# Patient Record
Sex: Female | Born: 1955
Health system: Southern US, Community
[De-identification: ages and names within clinical notes are randomized; demographics above are authoritative.]

## PROBLEM LIST (undated history)

## (undated) DIAGNOSIS — K219 Gastro-esophageal reflux disease without esophagitis: Secondary | ICD-10-CM

## (undated) DIAGNOSIS — E119 Type 2 diabetes mellitus without complications: Secondary | ICD-10-CM

## (undated) DIAGNOSIS — E039 Hypothyroidism, unspecified: Secondary | ICD-10-CM

## (undated) DIAGNOSIS — R911 Solitary pulmonary nodule: Secondary | ICD-10-CM

## (undated) DIAGNOSIS — E785 Hyperlipidemia, unspecified: Secondary | ICD-10-CM

## (undated) DIAGNOSIS — I10 Essential (primary) hypertension: Secondary | ICD-10-CM

## (undated) HISTORY — PX: BREAST CYST ASPIRATION: SHX578

## (undated) HISTORY — PX: ABDOMINAL HYSTERECTOMY: SHX81

## (undated) HISTORY — PX: BACK SURGERY: SHX140

## (undated) HISTORY — DX: Hyperlipidemia, unspecified: E78.5

## (undated) HISTORY — PX: THYROID SURGERY: SHX805

## (undated) HISTORY — PX: COLONOSCOPY: SHX174

## (undated) HISTORY — DX: Solitary pulmonary nodule: R91.1

## (undated) HISTORY — DX: Hypothyroidism, unspecified: E03.9

## (undated) HISTORY — DX: Essential (primary) hypertension: I10

## (undated) HISTORY — PX: LUNG BIOPSY: SHX232

---

## 1997-03-22 ENCOUNTER — Encounter: Payer: Self-pay | Admitting: Internal Medicine

## 1998-03-17 ENCOUNTER — Ambulatory Visit (HOSPITAL_COMMUNITY): Admission: RE | Admit: 1998-03-17 | Discharge: 1998-03-17 | Payer: Self-pay | Admitting: Surgery

## 1998-08-06 ENCOUNTER — Ambulatory Visit (HOSPITAL_COMMUNITY): Admission: RE | Admit: 1998-08-06 | Discharge: 1998-08-06 | Payer: Self-pay | Admitting: Pulmonary Disease

## 1999-10-16 ENCOUNTER — Encounter: Payer: Self-pay | Admitting: Internal Medicine

## 1999-10-16 ENCOUNTER — Emergency Department (HOSPITAL_COMMUNITY): Admission: EM | Admit: 1999-10-16 | Discharge: 1999-10-16 | Payer: Self-pay | Admitting: Emergency Medicine

## 2001-06-02 ENCOUNTER — Ambulatory Visit (HOSPITAL_COMMUNITY): Admission: RE | Admit: 2001-06-02 | Discharge: 2001-06-02 | Payer: Self-pay | Admitting: Family Medicine

## 2001-06-22 ENCOUNTER — Encounter: Admission: RE | Admit: 2001-06-22 | Discharge: 2001-06-22 | Payer: Self-pay | Admitting: Family Medicine

## 2001-06-22 ENCOUNTER — Encounter: Payer: Self-pay | Admitting: Family Medicine

## 2006-04-01 ENCOUNTER — Encounter: Admission: RE | Admit: 2006-04-01 | Discharge: 2006-04-01 | Payer: Self-pay | Admitting: Family Medicine

## 2006-04-08 ENCOUNTER — Encounter: Admission: RE | Admit: 2006-04-08 | Discharge: 2006-04-08 | Payer: Self-pay | Admitting: Family Medicine

## 2006-04-08 ENCOUNTER — Encounter (INDEPENDENT_AMBULATORY_CARE_PROVIDER_SITE_OTHER): Payer: Self-pay | Admitting: Specialist

## 2006-07-06 ENCOUNTER — Other Ambulatory Visit: Admission: RE | Admit: 2006-07-06 | Discharge: 2006-07-06 | Payer: Self-pay | Admitting: Nurse Practitioner

## 2007-05-17 ENCOUNTER — Encounter: Admission: RE | Admit: 2007-05-17 | Discharge: 2007-05-17 | Payer: Self-pay | Admitting: Physician Assistant

## 2007-12-12 ENCOUNTER — Inpatient Hospital Stay (HOSPITAL_COMMUNITY): Admission: RE | Admit: 2007-12-12 | Discharge: 2007-12-15 | Payer: Self-pay | Admitting: Neurosurgery

## 2008-05-13 ENCOUNTER — Ambulatory Visit: Payer: Self-pay | Admitting: *Deleted

## 2008-07-01 ENCOUNTER — Ambulatory Visit: Payer: Self-pay | Admitting: Internal Medicine

## 2008-07-01 ENCOUNTER — Encounter (INDEPENDENT_AMBULATORY_CARE_PROVIDER_SITE_OTHER): Payer: Self-pay | Admitting: Family Medicine

## 2008-07-01 LAB — CONVERTED CEMR LAB
ALT: 22 units/L (ref 0–35)
AST: 17 units/L (ref 0–37)
Albumin: 4 g/dL (ref 3.5–5.2)
Alkaline Phosphatase: 94 units/L (ref 39–117)
BUN: 8 mg/dL (ref 6–23)
Basophils Absolute: 0 10*3/uL (ref 0.0–0.1)
Basophils Relative: 0 % (ref 0–1)
CO2: 22 meq/L (ref 19–32)
Calcium: 9.5 mg/dL (ref 8.4–10.5)
Chloride: 104 meq/L (ref 96–112)
Cholesterol: 177 mg/dL (ref 0–200)
Creatinine, Ser: 0.85 mg/dL (ref 0.40–1.20)
Eosinophils Absolute: 0.2 10*3/uL (ref 0.0–0.7)
Eosinophils Relative: 3 % (ref 0–5)
Free T4: 1.34 ng/dL (ref 0.89–1.80)
Glucose, Bld: 95 mg/dL (ref 70–99)
HCT: 39.3 % (ref 36.0–46.0)
Hemoglobin: 13.3 g/dL (ref 12.0–15.0)
Lymphocytes Relative: 27 % (ref 12–46)
Lymphs Abs: 1.5 10*3/uL (ref 0.7–4.0)
MCHC: 33.8 g/dL (ref 30.0–36.0)
MCV: 89.9 fL (ref 78.0–100.0)
Monocytes Absolute: 0.4 10*3/uL (ref 0.1–1.0)
Monocytes Relative: 7 % (ref 3–12)
Neutro Abs: 3.5 10*3/uL (ref 1.7–7.7)
Neutrophils Relative %: 62 % (ref 43–77)
Platelets: 329 10*3/uL (ref 150–400)
Potassium: 3.8 meq/L (ref 3.5–5.3)
RBC: 4.37 M/uL (ref 3.87–5.11)
RDW: 13.6 % (ref 11.5–15.5)
Rhuematoid fact SerPl-aCnc: <20 intl units/mL (ref 0–20)
Sodium: 135 meq/L (ref 135–145)
TSH: 0.61 microintl units/mL (ref 0.350–4.50)
Total Bilirubin: 0.3 mg/dL (ref 0.3–1.2)
Total Protein: 9.6 g/dL — ABNORMAL HIGH (ref 6.0–8.3)
WBC: 5.6 10*3/uL (ref 4.0–10.5)

## 2008-09-30 ENCOUNTER — Ambulatory Visit: Payer: Self-pay | Admitting: Family Medicine

## 2008-09-30 LAB — CONVERTED CEMR LAB
ALT: 29 units/L (ref 0–35)
AST: 22 units/L (ref 0–37)
Albumin: 4.1 g/dL (ref 3.5–5.2)
Alkaline Phosphatase: 87 units/L (ref 39–117)
BUN: 11 mg/dL (ref 6–23)
CO2: 23 meq/L (ref 19–32)
Calcium: 9 mg/dL (ref 8.4–10.5)
Chloride: 104 meq/L (ref 96–112)
Creatinine, Ser: 0.93 mg/dL (ref 0.40–1.20)
Glucose, Bld: 113 mg/dL — ABNORMAL HIGH (ref 70–99)
Potassium: 4.1 meq/L (ref 3.5–5.3)
Sodium: 138 meq/L (ref 135–145)
Total Bilirubin: 0.3 mg/dL (ref 0.3–1.2)
Total Protein: 9.4 g/dL — ABNORMAL HIGH (ref 6.0–8.3)

## 2008-11-13 ENCOUNTER — Encounter: Payer: Self-pay | Admitting: Internal Medicine

## 2008-12-09 DIAGNOSIS — M359 Systemic involvement of connective tissue, unspecified: Secondary | ICD-10-CM | POA: Insufficient documentation

## 2008-12-09 DIAGNOSIS — J309 Allergic rhinitis, unspecified: Secondary | ICD-10-CM | POA: Insufficient documentation

## 2008-12-09 DIAGNOSIS — I10 Essential (primary) hypertension: Secondary | ICD-10-CM | POA: Insufficient documentation

## 2008-12-09 DIAGNOSIS — E039 Hypothyroidism, unspecified: Secondary | ICD-10-CM | POA: Insufficient documentation

## 2008-12-10 ENCOUNTER — Ambulatory Visit: Payer: Self-pay | Admitting: Internal Medicine

## 2008-12-10 DIAGNOSIS — J984 Other disorders of lung: Secondary | ICD-10-CM | POA: Insufficient documentation

## 2008-12-10 DIAGNOSIS — R0609 Other forms of dyspnea: Secondary | ICD-10-CM | POA: Insufficient documentation

## 2008-12-10 DIAGNOSIS — R0989 Other specified symptoms and signs involving the circulatory and respiratory systems: Secondary | ICD-10-CM | POA: Insufficient documentation

## 2008-12-10 DIAGNOSIS — R918 Other nonspecific abnormal finding of lung field: Secondary | ICD-10-CM | POA: Insufficient documentation

## 2008-12-19 ENCOUNTER — Telehealth (INDEPENDENT_AMBULATORY_CARE_PROVIDER_SITE_OTHER): Payer: Self-pay | Admitting: *Deleted

## 2008-12-19 ENCOUNTER — Encounter: Payer: Self-pay | Admitting: Internal Medicine

## 2009-01-07 ENCOUNTER — Encounter: Payer: Self-pay | Admitting: Internal Medicine

## 2009-01-09 ENCOUNTER — Ambulatory Visit: Payer: Self-pay | Admitting: Internal Medicine

## 2009-01-28 ENCOUNTER — Encounter: Admission: RE | Admit: 2009-01-28 | Discharge: 2009-01-28 | Payer: Self-pay | Admitting: Family Medicine

## 2009-04-07 ENCOUNTER — Encounter: Payer: Self-pay | Admitting: Internal Medicine

## 2009-05-19 ENCOUNTER — Telehealth: Payer: Self-pay | Admitting: Internal Medicine

## 2009-05-19 ENCOUNTER — Ambulatory Visit: Payer: Self-pay | Admitting: Internal Medicine

## 2009-07-18 ENCOUNTER — Ambulatory Visit: Payer: Self-pay | Admitting: Internal Medicine

## 2009-07-18 ENCOUNTER — Encounter: Payer: Self-pay | Admitting: Internal Medicine

## 2009-07-21 ENCOUNTER — Encounter: Payer: Self-pay | Admitting: Internal Medicine

## 2009-07-21 LAB — CONVERTED CEMR LAB
BUN: 8 mg/dL (ref 6–23)
Basophils Absolute: 0 10*3/uL (ref 0.0–0.1)
Basophils Relative: 0.4 % (ref 0.0–3.0)
CO2: 29 meq/L (ref 19–32)
Calcium: 9.9 mg/dL (ref 8.4–10.5)
Chloride: 102 meq/L (ref 96–112)
Creatinine, Ser: 1 mg/dL (ref 0.4–1.2)
Eosinophils Absolute: 0.2 10*3/uL (ref 0.0–0.7)
Eosinophils Relative: 3.1 % (ref 0.0–5.0)
GFR calc non Af Amer: 74.28 mL/min (ref >60)
Glucose, Bld: 93 mg/dL (ref 70–99)
HCT: 42.2 % (ref 36.0–46.0)
Hemoglobin: 14.1 g/dL (ref 12.0–15.0)
Lymphocytes Relative: 26.4 % (ref 12.0–46.0)
Lymphs Abs: 1.6 10*3/uL (ref 0.7–4.0)
MCHC: 33.3 g/dL (ref 30.0–36.0)
MCV: 93.2 fL (ref 78.0–100.0)
Monocytes Absolute: 0.3 10*3/uL (ref 0.1–1.0)
Monocytes Relative: 4.3 % (ref 3.0–12.0)
Neutro Abs: 4 10*3/uL (ref 1.4–7.7)
Neutrophils Relative %: 65.8 % (ref 43.0–77.0)
Platelets: 290 10*3/uL (ref 150.0–400.0)
Potassium: 3.9 meq/L (ref 3.5–5.1)
Pro B Natriuretic peptide (BNP): 11 pg/mL (ref 0.0–100.0)
RBC: 4.53 M/uL (ref 3.87–5.11)
RDW: 12.8 % (ref 11.5–14.6)
Sodium: 139 meq/L (ref 135–145)
TSH: 1.37 microintl units/mL (ref 0.35–5.50)
WBC: 6.1 10*3/uL (ref 4.5–10.5)

## 2009-12-31 ENCOUNTER — Ambulatory Visit: Payer: Self-pay | Admitting: Internal Medicine

## 2010-01-13 ENCOUNTER — Telehealth (INDEPENDENT_AMBULATORY_CARE_PROVIDER_SITE_OTHER): Payer: Self-pay | Admitting: *Deleted

## 2010-02-18 ENCOUNTER — Encounter: Admission: RE | Admit: 2010-02-18 | Discharge: 2010-02-18 | Payer: Self-pay | Admitting: Family Medicine

## 2010-08-11 ENCOUNTER — Other Ambulatory Visit: Admission: RE | Admit: 2010-08-11 | Discharge: 2010-08-11 | Payer: Self-pay | Admitting: Obstetrics and Gynecology

## 2010-11-23 ENCOUNTER — Encounter
Admission: RE | Admit: 2010-11-23 | Discharge: 2010-11-23 | Payer: Self-pay | Source: Home / Self Care | Attending: Family Medicine | Admitting: Family Medicine

## 2010-12-25 ENCOUNTER — Ambulatory Visit: Admit: 2010-12-25 | Payer: Self-pay | Admitting: Internal Medicine

## 2011-01-07 NOTE — Progress Notes (Signed)
Summary: notes  Phone Note From Other Clinic Call back at [phone]   Caller: dr. Katina Degree Office - [place] Call For: wert Summary of Call: want last few office notes and labs sent to Dr. Sanjuana Letters [phone] Initial call taken by: Eugene Gavia,  December 19, 2008 9:54 AM  Follow-up for Phone Call        Done. Consultation note from 1/5 along with cxr faxed via [facility] Follow-up by: Vernie Murders,  December 19, 2008 10:02 AM

## 2011-01-07 NOTE — Progress Notes (Signed)
----   Converted from flag ---- ---- 07/19/2009 6:16 AM, Nyoka Cowden MD wrote: needs cxr by now to f/u lung nodules ( q 6 mon needed and last done 07/18/09) ------------------------------  Pt was seen 12/31/09 with cxr. Karla Erickson  January 13, 2010 3:38 PM

## 2011-01-07 NOTE — Assessment & Plan Note (Signed)
Summary: Pulmonary/ ext summary fu with pfts   Referred by:  Lavonda Jumbo PA PCP:  Lavonda Jumbo PA  Chief Complaint:  1 month followup with PFT's.  Pt states that breathing is the same no better or worse.  She denies any new complaints today.Karla Erickson  History of Present Illness: 55 yobf quit smoking 1998 due to "strong words from the doctor" after lung bx by Edwyna Shell showing nonspecific inflammation in a left upper lobe nodule.  Subsequently seen by Carolin Sicks, at [facility] and [facility] with dx of connective tissue dx, RA like, with lung involvement.   December 10, 2008 seen as consult for E Griffin for doe x  worse since 1998 assoc with 100 lb of weight gain to point now where can still do grocery store but not the malls, limited also by knees  Also occ wake up and can't breath x sev years.  No itching or sneezing. No hemoptysis or sign cough.  impression was this was pf assoc with connective tissue dz/ obesity with possible GERD rx with diet as a first start  January 09, 2009 ov for PFT's c/o knees > sob with activity, beginning to lose wt, observing GERD diet. No assoc cough/hoarseness or  increase sob over baseline.  Pt denies any significant sore throat, nasal congestion or excess secretions, fever, chills, sweats, unintended wt loss, pleuritic or exertional cp, orthopnea pnd or leg swelling.  Pt also denies any obvious fluctuation in symptoms with weather or environmental change or other alleviating or aggravating factors.             Updated Prior Medication List: ALLEGRA 180 MG TABS (FEXOFENADINE HCL) 1 once daily LEVOTHYROXINE SODIUM 125 MCG TABS (LEVOTHYROXINE SODIUM) 1 once daily AMLODIPINE BESYLATE 5 MG TABS (AMLODIPINE BESYLATE) 1 once daily TYLENOL EXTRA STRENGTH 500 MG TABS (ACETAMINOPHEN) as directed as needed  Current Allergies (reviewed today): No known allergies  Past Medical History:    MORBID OBESITY (ICD-278.01)        - Target wt  =  163 for BMI < 30 , wt =145 when  quit smoking    CONNECTIVE TISSUE DISEASE (ICD-710.9)       - Dx RA by        - PFTs 01/09/09 vital capacity 88% diffusing capacity 66% ERV 41%    HYPOTHYROIDISM (ICD-244.9)    HYPERTENSION (ICD-401.9)    ALLERGIC RHINITIS (ICD-477.9)    Pulmonary nodules        - LUL Bx 03/22/97 Edwyna Shell) = inflammatory with predom lymphocytes and plasma cells, no vasculitis          Family History:    Heart dz- Brother    sister RA    neg lung dz  Social History:    Reviewed history from 12/10/2008 and no changes required:       Former smoker.  Quit in 1998.  Smoked 1/2 ppd x 15 years.       No ETOH       Separated       Children       Unemployed   Review of Systems  The patient denies anorexia, fever, weight loss, weight gain, vision loss, decreased hearing, hoarseness, chest pain, syncope, peripheral edema, prolonged cough, headaches, hemoptysis, abdominal pain, melena, hematochezia, severe indigestion/heartburn, hematuria, incontinence, muscle weakness, suspicious skin lesions, transient blindness, depression, unusual weight change, abnormal bleeding, enlarged lymph nodes, and angioedema.    Vital Signs:  Patient Profile:   55 Years Old Female  Height:     63 inches Weight:      217 pounds O2 Sat:      99 % O2 treatment:    Room Air Temp:     97.8 degrees F oral Pulse rate:   99 / minute BP sitting:   120 / 78  (left arm)  Vitals Entered By: Vernie Murders (January 09, 2009 11:10 AM)                Physical Exam  wt 220 December 10, 2008  > 217 January 09, 2009  Ambulatory obese bf  in no acute distress. Afeb with normal vital signs HEENT: nl dentition, turbinates, and orophanx. Nl external ear canals without cough reflex Neck without JVD/Nodes/TM Lungs clear to A and P bilaterally without cough on insp or exp maneuvers RRR no s3 or murmur or increase in P2 Abd soft and benign with nl excursion in the supine position. No bruits or organomegaly Ext warm without calf  tenderness, cyanosis clubbing or edema Skin warm and dry without lesions     Impression & Recommendations:  Problem # 1:  DYSPNEA (ICD-786.09) I had an extended discussion with the patient today lasting 15 to 20 minutes of a 25 minute visit on the following issues:  I reviewed her PFTs as recorded underPMHx indicating adequate lung vol with disproportionate reduction in expiratory reserve vol which is typical of obesity, not interstitial lung disease.  Note also diffusing capacity corrects to 130%.  regardless of the exact mechanism of her dyspnea, however, at this point I would not expect her to be able to work in a job requiring her to be on her feet or moving around bearing weight, or because of obesity deconditioning and arthritis however than because of limiting lung disease.  she probably does have underlying mild connective tissue disease associated ulnar fibrosis and this does need long-term follow-up on a serial basis for which I recommended she return in 6 months sooner if needed.   Orders: Est. Patient Level IV (29562)   Problem # 2:  PULMONARY NODULE (ICD-518.89) radiographically stable for over a year and previously underwent a biopsy which probably was nothing more than connective tissue disease involvement with the lung and I would not therefore pursue a tissue diagnosis again now. Orders: Est. Patient Level IV (13086)   Medications Added to Medication List This Visit: 1)  Tylenol Extra Strength 500 Mg Tabs (Acetaminophen) .... As directed as needed  Complete Medication List: 1)  Allegra 180 Mg Tabs (Fexofenadine hcl) .Karla Erickson.. 1 once daily 2)  Levothyroxine Sodium 125 Mcg Tabs (Levothyroxine sodium) .Karla Erickson.. 1 once daily 3)  Amlodipine Besylate 5 Mg Tabs (Amlodipine besylate) .Karla Erickson.. 1 once daily 4)  Tylenol Extra Strength 500 Mg Tabs (Acetaminophen) .... As directed as needed  Patient Instructions: 1)  Your lung dz is mild fibrosis/scarring of the lungs most likely the result  of previous pneumonia or rheumatism and is made much worse because of weight gain. 2)  I would not expect you to be able to do physical work for a living but could do desk work full time 3)  Please schedule a follow-up appointment in 6 months sooner if feel breathing is worse - we will do PFT and CXR at that time

## 2011-01-07 NOTE — Letter (Signed)
Summary: [facility]   Imported By: Esmeralda Links D'jimraou 12/27/2008 15:02:31  _____________________________________________________________________  External Attachment:    Type:   Image     Comment:   External Document

## 2011-01-07 NOTE — Letter (Signed)
Summary: Missed appointment/[facility]  Missed appointment/[facility]   Imported By: Esmeralda Links D'jimraou 12/14/2008 10:41:28  _____________________________________________________________________  External Attachment:    Type:   Image     Comment:   External Document

## 2011-01-07 NOTE — Letter (Signed)
Summary: [facility]   Imported By: Esmeralda Links D'jimraou 12/14/2008 10:32:31  _____________________________________________________________________  External Attachment:    Type:   Image     Comment:   External Document

## 2011-01-07 NOTE — Letter (Signed)
Summary: [facility]  [address]   [place], [place] [address]   Phone: [phone]  Fax: [phone]    07/21/2009  Demita Render [address] [place], [place]  [address]  Dear Ms. Albin,    We have been trying to reach you to let you know that your lab results all came back normal and that there are no changes in your therapy needed at this time. Please call our office with a correct phone number that you can be reached at. If you have any questions or concerns, please call our office. Thank you for your time.           Sincerely,   Dr. Sandrea Hughs

## 2011-01-07 NOTE — Letter (Signed)
Summary: [facility]   Imported By: Lester Bright 04/18/2009 11:26:09  _____________________________________________________________________  External Attachment:    Type:   Image     Comment:   External Document

## 2011-01-07 NOTE — Progress Notes (Signed)
Summary: CXR call report  Phone Note From Other Clinic   Caller: Dr. Carmelina Noun radiology Call For: Dr.Wert Summary of Call: Radiology called and stated chest xray shows left lung nodule. Radiologust recs CT of Chest.  Initial call taken by: Carron Curie CMA,  May 19, 2009 11:09 AM  Follow-up for Phone Call        aware, see addendum to cxr and ov same day Follow-up by: Nyoka Cowden MD,  May 19, 2009 7:47 PM

## 2011-01-07 NOTE — Assessment & Plan Note (Signed)
Summary: Pulmonary consultation/ doe plus spn    Visit Type:  Initial Consult Referred by:  Lavonda Jumbo PA PCP:  Lavonda Jumbo PA  Chief Complaint:  SOB.  History of Present Illness: 55 yobf quit smoking 1998 due to "strong words from the doctor" after lung bx by Edwyna Shell showing nonspecific inflammation in a left upper lobe nodule.  Subsequently seen by Carolin Sicks, at [facility] and [facility] with dx of connective tissue dx, RA like, with lung involvement.   December 10, 2008 seen as consult for E Griffin for doe x  worse since 1998 assoc with 100 lb of weight gain to point now where can still do grocery store but not the malls, limited also by knees  Also occ wake up and can't breath x sev years.  No itching or sneezing. No hemoptysis or sign cough.  Pt denies any significant sore throat, nasal congestion or excess secretions, fever, chills, sweats, unintended wt loss, pleuritic or exertional cp, orthopnea pnd or leg swelling.  Pt also denies any obvious fluctuation in symptoms with weather or environmental change or other alleviating or aggravating factors.        Serial Vital Signs/Assessments:  Comments: 9:59 AM Ambulatory Pulse Oximetry  Resting; HR__97___    02 Sat__96%ra___  Lap1 (185 feet)   HR__100___   02 Sat__93%ra___ Lap2 (185 feet)   HR__103___   02 Sat__91%ra___    Lap3 (185 feet)   HR_____   02 Sat_____  ___Test Completed without Difficulty _x__Test Stopped due to: pt c/o sob and pain in knees   By: Vernie Murders      Updated Prior Medication List: ALLEGRA 180 MG TABS (FEXOFENADINE HCL) 1 once daily NABUMETONE 500 MG TABS (NABUMETONE) 1 two times a day FEXMID 7.5 MG TABS (CYCLOBENZAPRINE HCL) 1 three times a day LEVOTHYROXINE SODIUM 125 MCG TABS (LEVOTHYROXINE SODIUM) 1 once daily AMLODIPINE BESYLATE 5 MG TABS (AMLODIPINE BESYLATE) 1 once daily  Current Allergies (reviewed today): No known allergies   Past Medical History:    MORBID OBESITY  (ICD-278.01)        - Target wt  =  163 for BMI < 30 , wt =145 when quit smoking    CONNECTIVE TISSUE DISEASE (ICD-710.9)       - Dx RA by Ashley Akin ?1999    HYPOTHYROIDISM (ICD-244.9)    HYPERTENSION (ICD-401.9)    ALLERGIC RHINITIS (ICD-477.9)    Pulmonary nodules        - LUL Bx 03/22/97 Edwyna Shell) = inflammatory with predom lymphocytes and plasma cells, no vasculitis           Family History:    Heart dz- Brother    sister RA  Social History:    Former smoker.  Quit in 1998.  Smoked 1/2 ppd x 15 years.    No ETOH    Separated    Children    Unemployed    Review of Systems  The patient denies anorexia, fever, weight loss, weight gain, vision loss, decreased hearing, hoarseness, chest pain, syncope, peripheral edema, prolonged cough, headaches, hemoptysis, abdominal pain, melena, hematochezia, severe indigestion/heartburn, hematuria, incontinence, muscle weakness, suspicious skin lesions, transient blindness, depression, unusual weight change, abnormal bleeding, and enlarged lymph nodes.     Vital Signs:  Patient Profile:   56 Years Old Female Height:     63 inches Weight:      220 pounds O2 Sat:      98 % O2 treatment:    Room Air Temp:  98.1 degrees F oral Pulse rate:   87 / minute BP sitting:   120 / 78  (left arm) Cuff size:   large  Vitals Entered By: Vernie Murders (December 10, 2008 9:18 AM)                 Physical Exam  wt 220 December 10, 2008  Ambulatory healthy appearing in no acute distress. Afeb with normal vital signs HEENT: nl dentition, turbinates, and orophanx. Nl external ear canals without cough reflex Neck without JVD/Nodes/TM Lungs clear to A and P bilaterally without cough on insp or exp maneuvers RRR no s3 or murmur or increase in P2 Abd soft and benign with nl excursion in the supine position. No bruits or organomegaly Ext warm without calf tenderness, cyanosis clubbing or edema Skin warm and dry without lesions      CXR  Procedure date:  12/10/2008  Findings:      rml nodule unchanged vs previous studies  CXR  Procedure date:  12/10/2008  Findings:      correction:  nodule is in deep sulcus of rll posteriorly and no change since 12/11/07   Pulmonary Function Test Height (in.): 63 Gender: Female    Impression & Recommendations:  Problem # 1:  DYSPNEA (ICD-786.09) PFT's without sign airflow obstruction 10/29/08   multifactorial dyspnea without desaturation documented at the level of activity that she says she can no longer tolerate more due to knee pain and dyspnea.  However, it is most likely that she has chronic interstitial lung disease related to  undiagnosed connective tissue disease, most similar to rheumatoid arthritis clinically.  Recommend that we proceed with a full set of PFTs including DLC0 and in the meantime treat her empirically for reflux with PPI and diet and encourage her to make every effort to lose weight or      Problem # 2:  PULMONARY NODULE (ICD-518.89) she is a former smoker so this does not get [place] completely off the hook; however, most likely this is the same problem she had biopsied on the left previously and simply needs to be watched conservatively and only attempt to biopsy it if there is macroscopic evidence of progression while trying to achieve better systemic control of inflammation for which she plans to see a rheumatologist within the next two weeks.      Medications Added to Medication List This Visit: 1)  Amlodipine Besylate 5 Mg Tabs (Amlodipine besylate) .Marland Kitchen.. 1 once daily  Complete Medication List: 1)  Allegra 180 Mg Tabs (Fexofenadine hcl) .Marland Kitchen.. 1 once daily 2)  Nabumetone 500 Mg Tabs (Nabumetone) .Marland Kitchen.. 1 two times a day 3)  Fexmid 7.5 Mg Tabs (Cyclobenzaprine hcl) .Marland Kitchen.. 1 three times a day 4)  Levothyroxine Sodium 125 Mcg Tabs (Levothyroxine sodium) .Marland Kitchen.. 1 once daily 5)  Amlodipine Besylate 5 Mg Tabs (Amlodipine besylate) .Marland Kitchen.. 1 once  daily   Patient Instructions: 1)  GERD (gastroesophageal reflux disease) was discussed. It is a common cause of respiratory symptoms. It commonly presents in the absence of heartburn. GERD can be treated with medication, but also with lifestyle changes including avoidance of late meals, excessive alcohol, smoking cessation, and avoid fatty foods, chocolate, peppermint, colas, red wine, and acidic juices such as orange juice. NO MINT OR MENTHOL PRODUCTS (no cough drops) 2)  USE SUGARLESS CANDY INSTEAD (jolley ranchers)  3)  Please schedule a follow-up appointment in 1 month with PFT's   ]

## 2011-01-07 NOTE — Assessment & Plan Note (Signed)
Summary: Pulmonary/ fu ILD, spn   Copy to:  Lavonda Jumbo PA Primary Provider/Referring Provider:  Lavonda Jumbo PA  CC:  4 month followup.  Pt denies any complaints today.  Breathing the same no better or worse.Marland Kitchen  History of Present Illness: 55 yobf quit smoking 1998 due to "strong words from the doctor" after lung bx by Edwyna Shell showing nonspecific inflammation in a left upper lobe nodule.  Subsequently seen by Carolin Sicks, at [facility] and [facility] with dx of connective tissue dx, RA like, with lung involvement.   December 10, 2008 seen as consult for E Griffin for doe x  worse since 1998 assoc with 100 lb of weight gain to point now where can still do grocery store but not the malls, limited also by knees  Also occ wake up and can't breath x sev years.  No itching or sneezing. No hemoptysis or sign cough.  impression was this was pf assoc with connective tissue dz/ obesity with possible GERD rx with diet as a first start  January 09, 2009 ov for PFT's c/o knees > sob with activity, beginning to lose wt, observing GERD diet. No assoc cough/hoarseness or  increase sob over baseline.  pft's more c/w obesity than ild and no desat walking.  rec no change in rx, wt loss key.  May 19, 2009 ov continues to be more limited by aches and pains, walking with cane, not limited by sob. no cough,  Pt denies any significant sore throat, nasal congestion or excess secretions, fever, chills, sweats, unintended wt loss, pleuritic or exertional cp, orthopnea pnd or leg swelling.  Pt also denies any obvious fluctuation in symptoms with weather or environmental change or other alleviating or aggravating factors.          Current Medications (verified): 1)  Allegra 180 Mg Tabs (Fexofenadine Hcl) .Marland Kitchen.. 1 Once Daily 2)  Levothyroxine Sodium 125 Mcg Tabs (Levothyroxine Sodium) .Marland Kitchen.. 1 Once Daily 3)  Amlodipine Besylate 5 Mg Tabs (Amlodipine Besylate) .Marland Kitchen.. 1 Once Daily 4)  Tylenol Extra Strength 500 Mg Tabs  (Acetaminophen) .... As Directed As Needed 5)  Fish Oil 1000 Mg Caps (Omega-3 Fatty Acids) .Marland Kitchen.. 1 Once Daily  Allergies (verified): No Known Drug Allergies  Past History:  Past Medical History: MORBID OBESITY (ICD-278.01)     - Target wt  =  163 for BMI < 30 , wt =145 when quit smoking CONNECTIVE TISSUE DISEASE (ICD-710.9)    - Dx RA     - PFTs 01/09/09 vital capacity 88% diffusing capacity 66% ERV 41% HYPOTHYROIDISM (ICD-244.9) HYPERTENSION (ICD-401.9) ALLERGIC RHINITIS (ICD-477.9) Pulmonary nodules     - LUL Bx 03/22/97 Edwyna Shell) = inflammatory with predom lymphocytes and plasma cells, no vasculitis  Family History: Reviewed history from 01/09/2009 and no changes required. Heart dz- Brother sister RA neg lung dz  Social History: Reviewed history from 12/10/2008 and no changes required. Former smoker.  Quit in 1998.  Smoked 1/2 ppd x 15 years. No ETOH Separated Children Unemployed  Vital Signs:  Patient profile:   55 year old female Weight:      217 pounds BMI:     38.58 O2 Sat:      98 % on Room air Temp:     98.5 degrees F oral Pulse rate:   99 / minute BP sitting:   120 / 74  (left arm)  Vitals Entered By: Vernie Murders (May 19, 2009 9:20 AM)  O2 Flow:  Room air  Physical  Exam  Additional Exam:  wt 220 December 10, 2008  > 217 January 09, 2009 > 271 May 19, 2009  Ambulatory obese bf  in no acute distress, walks slow with cane Afeb with normal vital signs HEENT: nl  turbinates, and orophanx. Nl external ear canals without cough reflex. edentulous Neck without JVD/Nodes/TM Lungs clear to A and P bilaterally without cough on insp or exp maneuvers, no sign crackles on insp RRR no s3 or murmur or increase in P2 Abd soft and benign with nl excursion in the supine position. No bruits or organomegaly Ext warm without calf tenderness, cyanosis clubbing or edema Skin warm and dry without lesions     Impression & Recommendations:  Problem # 1:  DYSPNEA  (ICD-786.09)  Minimal ILD related to CV dz, no worse lately, need PFT's in 07/2009 to see if VC and DLC0 maintained and repeat walking sats then  I would definitely avoid fish oil in this setting unless there is very compelling reason to recommend it - her body habitus places her at high risk of reflux (acid and oil both create a similar  problem for the lung in terms of nonspecific reaction to injury)  Problem # 2:  PULMONARY NODULE (ICD-518.89) Former smoker, prev underwent open lung bx for similar nodule, similar location, so most likely the LUL  is another inflammatory lesion related to RA and the other process in the RLL but needs to be watched closely in setting of previous smoking hx.  REC f/u at 3 months, placed in tickle file  Medications Added to Medication List This Visit: 1)  Fish Oil 1000 Mg Caps (Omega-3 fatty acids) .Marland Kitchen.. 1 once daily  Other Orders: T-2 View CXR, Same Day (71020.5TC) Est. Patient Level IV (04540)  Patient Instructions: 1)  stop fish oil since you don't feel it's helping and could potentially hurt your lungs 2)  Please schedule a follow-up appointment in 2 months with PFT's 3)  NEEDS WALKING SATS ON RETURN ALSO AND REPEAT CXR

## 2011-01-07 NOTE — Assessment & Plan Note (Signed)
Summary: Pulmonary/ f/u nodules   Copy to:  Lavonda Jumbo PA Primary Provider/Referring Provider:  Lavonda Jumbo PA  CC:  Followup.  Pt states that breathing is fine.  She has had sore throat x 2 wks.  She states that it is getting better now.  She has been also coughing up grey colored sputum.Marland Kitchen  History of Present Illness: 40 yobf quit smoking 1998 due to "strong words from the doctor" after lung bx by Edwyna Shell showing nonspecific inflammation in a left upper lobe nodule.  Subsequently seen by Carolin Sicks, at [facility] and [facility] with dx of connective tissue dx, RA like, with lung involvement.   December 10, 2008 seen as consult for E Griffin for doe x  worse since 1998 assoc with 100 lb of weight gain to point now where can still do grocery store but not the malls, limited also by knees  Also occ wake up and can't breath x sev years.  No itching or sneezing. No hemoptysis or sign cough.  impression was this was pf assoc with connective tissue dz/ obesity with possible GERD rx with diet as a first start  January 09, 2009 ov for PFT's c/o knees > sob with activity, beginning to lose wt, observing GERD diet. No assoc cough/hoarseness or  increase sob over baseline.  pft's more c/w obesity than ild and no desat walking.  rec no change in rx, wt loss key.  May 19, 2009 ov continues to be more limited by aches and pains, walking with cane, not limited by sob. no cough, so no change in recs.  July 18, 2009 more tired than sob,  limited more by knees and back. No cough.   December 31, 2009 Followup.  Pt states that breathing is fine.  She has had sore throat x 2 wks.  She states that it is getting better now.  She has been also coughing up grey colored sputum. Pt denies any significant sore throat, dysphagia, itching, sneezing,  nasal congestion or excess secretions,  fever, chills, sweats, unintended wt loss, pleuritic or exertional cp, hempoptysis, change in activity tolerance  orthopnea pnd or leg  swelling      Current Medications (verified): 1)  Levothyroxine Sodium 125 Mcg Tabs (Levothyroxine Sodium) .Marland Kitchen.. 1 Once Daily 2)  Amlodipine Besylate 5 Mg Tabs (Amlodipine Besylate) .Marland Kitchen.. 1 Once Daily 3)  Diclofenac Sodium 75 Mg Tbec (Diclofenac Sodium) .Marland Kitchen.. 1 Two Times A Day  Allergies (verified): No Known Drug Allergies  Past History:  Past Medical History: MORBID OBESITY (ICD-278.01)     - Target wt  =  163 for BMI < 30 , wt =145 when quit smoking CONNECTIVE TISSUE DISEASE (ICD-710.9)    - Dx ?  RA     - PFTs 01/09/09    VC 88%   DLCO 66% ERV 41%    - PFT's 07/18/09 VC 93 %  DLC0 70%  ERV 17% HYPOTHYROIDISM (ICD-244.9) HYPERTENSION (ICD-401.9) ALLERGIC RHINITIS (ICD-477.9) Pulmonary nodules     - LUL Bx 03/22/97 Edwyna Shell) =  inflammatory with predom lymphocytes and plasma cells, no vasculitis  Vital Signs:  Patient profile:   55 year old female Weight:      216 pounds O2 Sat:      94 % on Room air Temp:     97.9 degrees F oral Pulse rate:   100 / minute BP sitting:   112 / 80  (left arm)  Vitals Entered By: Vernie Murders (December 31, 2009 9:37  AM)  O2 Flow:  Room air  Physical Exam  Additional Exam:  wt 220 December 10, 2008  > 217 January 09, 2009 > 217 May 19, 2009 > 215 July 18, 2009  > 216 December 31, 2009  Ambulatory obese bf  in no acute distress, walks slow with cane Afeb with normal vital signs HEENT: nl  turbinates, and orophanx. Nl external ear canals without cough reflex. edentulous Neck without JVD/Nodes/TM Lungs clear to A and P bilaterally without cough on insp or exp maneuvers, no sign crackles on insp RRR no s3 or murmur or increase in P2 no edema Abd soft and benign with nl excursion in the supine position. No bruits or organomegaly Ext warm without calf tenderness, cyanosis clubbing Skin warm and dry without lesions     CXR  Procedure date:  12/31/2009  Findings:      Comparison: Chest x-ray of 07/18/2009 and 05/19/2009   Findings: The  vague nodular opacity noted previously in the periphery of the left mid lung appears stable and is most consistent with scarring.  Linear scarring at the left lung base also is stable.  No active infiltrate or effusion is seen.  The heart is within normal limits in size.  Surgical clips overlie the left lower neck.  No bony abnormality is seen.   IMPRESSION: Stable vague opacity in the peripheral of the left mid lung most consistent with scarring with adjacent linear scarring as well.  No definite active process.  Impression & Recommendations:  Problem # 1:  PULMONARY NODULE (ICD-518.89) Cxr's reviewed, c/w benign inflammatory process and may represent a form of RA lung dz s/p nonspefic bx and w/u previously here and and [facility]  Discussed in detail all the  indications, usual  risks and alternatives  relative to the benefits with patient who agrees to proceed with conservative f/u.  Medications Added to Medication List This Visit: 1)  Diclofenac Sodium 75 Mg Tbec (Diclofenac sodium) .Marland Kitchen.. 1 two times a day  Other Orders: T-2 View CXR (71020TC) Est. Patient Level III (16109)  Patient Instructions: 1)  You need follow up with yearly cxr .

## 2011-01-07 NOTE — Miscellaneous (Signed)
Summary: Orders Update-PFT charges  Clinical Lists Changes  Orders: Added new Service order of Spirometry (Pre & Post) (94060) - Signed Added new Service order of Lung Volumes (94240) - Signed Added new Service order of Carbon Monoxide diffusing w/capacity (94720) - Signed 

## 2011-01-07 NOTE — Assessment & Plan Note (Signed)
Summary: Pulmonary/ ext f/u sob and mpn   Copy to:  Lavonda Jumbo PA Primary Provider/Referring Provider:  Lavonda Jumbo PA  CC:  2 month followup with PFT's and cxr.  Pt states that her breathing is the same- no better or worse.  No new complaints today.Karla Erickson  History of Present Illness: 11 yobf quit smoking 1998 due to "strong words from the doctor" after lung bx by Edwyna Shell showing nonspecific inflammation in a left upper lobe nodule.  Subsequently seen by Carolin Sicks, at [facility] and [facility] with dx of connective tissue dx, RA like, with lung involvement.   December 10, 2008 seen as consult for E Griffin for doe x  worse since 1998 assoc with 100 lb of weight gain to point now where can still do grocery store but not the malls, limited also by knees  Also occ wake up and can't breath x sev years.  No itching or sneezing. No hemoptysis or sign cough.  impression was this was pf assoc with connective tissue dz/ obesity with possible GERD rx with diet as a first start  January 09, 2009 ov for PFT's c/o knees > sob with activity, beginning to lose wt, observing GERD diet. No assoc cough/hoarseness or  increase sob over baseline.  pft's more c/w obesity than ild and no desat walking.  rec no change in rx, wt loss key.  May 19, 2009 ov continues to be more limited by aches and pains, walking with cane, not limited by sob. no cough, so no change in recs.  July 18, 2009 more tired than sob,  limited more by knees and back. No cough.  Pt denies any significant sore throat, nasal congestion or excess secretions, fever, chills, sweats, unintended wt loss, pleuritic or exertional cp, orthopnea pnd or leg swelling.  Pt also denies any obvious fluctuation in symptoms with weather or environmental change or other alleviating or aggravating factors.            Current Medications (verified): 1)  Allegra 180 Mg Tabs (Fexofenadine Hcl) .Karla Erickson.. 1 Once Daily 2)  Levothyroxine Sodium 125 Mcg Tabs (Levothyroxine  Sodium) .Karla Erickson.. 1 Once Daily 3)  Amlodipine Besylate 5 Mg Tabs (Amlodipine Besylate) .Karla Erickson.. 1 Once Daily 4)  Tylenol Extra Strength 500 Mg Tabs (Acetaminophen) .... As Directed As Needed  Allergies (verified): No Known Drug Allergies  Past History:  Past Medical History: MORBID OBESITY (ICD-278.01)     - Target wt  =  163 for BMI < 30 , wt =145 when quit smoking CONNECTIVE TISSUE DISEASE (ICD-710.9)    - Dx RA     - PFTs 01/09/09    VC 88%   DLCO 66% ERV 41%    - PFT's 07/18/09 VC 93 %  DLC0 70%  ERV 17% HYPOTHYROIDISM (ICD-244.9) HYPERTENSION (ICD-401.9) ALLERGIC RHINITIS (ICD-477.9) Pulmonary nodules     - LUL Bx 03/22/97 Edwyna Shell) = inflammatory with predom lymphocytes and plasma cells, no vasculitis  Past Surgical History: Thyroidectomy Partial Hysterectomy  Back surgery LUL lung bx 1998......................................................Karla KitchenBurney  Vital Signs:  Patient profile:   55 year old female Weight:      215 pounds O2 Sat:      94 % on Room air Temp:     98.0 degrees F oral Pulse rate:   111 / minute BP sitting:   128 / 88  (left arm)  Vitals Entered By: Vernie Murders (July 18, 2009 3:17 PM)  O2 Flow:  Room air  Serial Vital Signs/Assessments:  Comments: 4:03 PM Ambulatory Pulse Oximetry  Resting; HR__112___    02 Sat95%ra_____  Lap1 (185 feet)   HR_120____   02 Sat__95%ra___ Lap2 (185 feet)   HR___125__   02 Sat___93%ra__    Lap3 (185 feet)   HR_____   02 Sat_____  ___Test Completed without Difficulty __x_Test Stopped due to: SOB   By: Vernie Murders    Physical Exam  Additional Exam:  wt 220 December 10, 2008  > 217 January 09, 2009 > 217 May 19, 2009 > 215 July 18, 2009  Ambulatory obese bf  in no acute distress, walks slow with cane Afeb with normal vital signs HEENT: nl  turbinates, and orophanx. Nl external ear canals without cough reflex. edentulous Neck without JVD/Nodes/TM Lungs clear to A and P bilaterally without cough on insp or  exp maneuvers, no sign crackles on insp RRR no s3 or murmur or increase in P2 no edema Abd soft and benign with nl excursion in the supine position. No bruits or organomegaly Ext warm without calf tenderness, cyanosis clubbing Skin warm and dry without lesions    FastTSH                   1.37 uIU/mL                 0.35-5.50  Tests: (2) CBC Platelet w/Diff (CBCD)   White Cell Count          6.1 K/uL                    4.5-10.5   Red Cell Count            4.53 Mil/uL                 3.87-5.11   Hemoglobin                14.1 g/dL                   47.8-29.5   Hematocrit                42.2 %                      36.0-46.0   MCV                       93.2 fl                     78.0-100.0   MCHC                      33.3 g/dL                   62.1-30.8   RDW                       12.8 %                      11.5-14.6   Platelet Count            290.0 K/uL                  150.0-400.0   Neutrophil %              65.8 %  43.0-77.0   Lymphocyte %              26.4 %                      12.0-46.0   Monocyte %                4.3 %                       3.0-12.0   Eosinophils%              3.1 %                       0.0-5.0   Basophils %               0.4 %                       0.0-3.0   Neutrophill Absolute      4.0 K/uL                    1.4-7.7   Lymphocyte Absolute       1.6 K/uL                    0.7-4.0   Monocyte Absolute         0.3 K/uL                    0.1-1.0  Eosinophils, Absolute                             0.2 K/uL                    0.0-0.7   Basophils Absolute        0.0 K/uL                    0.0-0.1  Tests: (3) BMP (METABOL)   Sodium                    139 mEq/L                   135-145   Potassium                 3.9 mEq/L                   3.5-5.1   Chloride                  102 mEq/L                   96-112   Carbon Dioxide            29 mEq/L                    19-32   Glucose                   93 mg/dL                    16-10   BUN                        8 mg/dL  6-23   Creatinine                1.0 mg/dL                   2.8-4.1   Calcium                   9.9 mg/dL                   3.2-44.0   GFR                       74.28 mL/min                >60  Tests: (4) B-Type Natiuretic Peptide (BNPR)  B-Type Natriuetic Peptide                             11.0 pg/mL                  0.0-100.0  CXR  Procedure date:  07/18/2009  Findings:      Relatively unchanged nodular opacity overlying the mid left lung. .   Bibasilar scarring and chronic mild peribronchial thickening  Impression & Recommendations:  Problem # 1:  DYSPNEA (ICD-786.09) I had an extended discussion with the patient today lasting 15 to 20 minutes of a 25 minute visit on the following issues:    Minimal ILD related to CV dz, no worse by PFT's no desat walking, so most of her problem is obesity supported strongly by  The disproportionate reduction in ERV.  Weight control is a matter of calorie balance which needs to be tilted in the pt's favor by eating less and exercising more.  Specifically, I recommended  exercise at a level where pt  is short of breath but not out of breath 30 minutes daily.  If not losing weight on this program, I would strongly recommend pt see a nutritionist with a food diary recorded for two weeks prior to the visit.     Problem # 2:  PULMONARY NODULE (ICD-518.89) Previously bx'd on on left and found to be inflammatory going along with her collagen vasc dz, no macroscopic progression so no need to repeat bx in this setting.  F/u q 6 months critical, will place in tickle file for this purpose.  Discussed in detail all the  indications, usual  risks and alternatives  relative to the benefits with patient who agrees to proceed with conservative f/u.Karla Erickson   Other Orders: Pulse Oximetry, Ambulatory (10272) T-2 View CXR (71020TC) TLB-TSH (Thyroid Stimulating Hormone) (84443-TSH) TLB-CBC Platelet - w/Differential  (85025-CBCD) TLB-BMP (Basic Metabolic Panel-BMET) (80048-METABOL) TLB-BNP (B-Natriuretic Peptide) (83880-BNPR) Est. Patient Level IV (53664)  Patient Instructions: 1)  Please schedule a follow-up appointment in 3 months, sooner if needed

## 2011-01-07 NOTE — Miscellaneous (Signed)
Summary: Orders Update pft charges  Clinical Lists Changes  Orders: Added new Service order of Carbon Monoxide diffusing w/capacity (94720) - Signed Added new Service order of Lung Volumes (94240) - Signed Added new Service order of Spirometry (Pre & Post) (94060) - Signed 

## 2011-01-07 NOTE — Letter (Signed)
Summary: [facility]   Imported By: Esmeralda Links D'jimraou 01/10/2009 12:15:11  _____________________________________________________________________  External Attachment:    Type:   Image     Comment:   External Document

## 2011-01-12 ENCOUNTER — Ambulatory Visit: Payer: Self-pay | Admitting: Internal Medicine

## 2011-01-19 ENCOUNTER — Other Ambulatory Visit: Payer: Self-pay | Admitting: Family Medicine

## 2011-01-19 DIAGNOSIS — Z1231 Encounter for screening mammogram for malignant neoplasm of breast: Secondary | ICD-10-CM

## 2011-02-11 ENCOUNTER — Encounter: Payer: Self-pay | Admitting: Internal Medicine

## 2011-02-11 ENCOUNTER — Ambulatory Visit (INDEPENDENT_AMBULATORY_CARE_PROVIDER_SITE_OTHER): Payer: Medicare Other | Admitting: Internal Medicine

## 2011-02-11 DIAGNOSIS — J8410 Pulmonary fibrosis, unspecified: Secondary | ICD-10-CM

## 2011-02-11 DIAGNOSIS — J984 Other disorders of lung: Secondary | ICD-10-CM

## 2011-02-16 NOTE — Assessment & Plan Note (Signed)
Summary: Pulmonary/ f/u spn/ pf related to connective tissue dz    Copy to:  Lavonda Jumbo PA Primary Provider/Referring Provider:  Lavonda Jumbo PA  CC:  Pulmonary Nodule.  History of Present Illness: 55  yobf quit smoking 1998 due to "strong words from the doctor" after lung bx by Edwyna Shell showing nonspecific inflammation in a left upper lobe nodule.  Subsequently seen by Carolin Sicks, at [facility] and [facility] with dx of connective tissue dx, RA like, with lung involvement.   December 10, 2008 seen as consult for E Griffin for doe x  worse since 1998 assoc with 100 lb of weight gain to point now where can still do grocery store but not the malls, limited also by knees  Also occ wake up and can't breath x sev years.  No itching or sneezing. No hemoptysis or sign cough.  impression was this was pf assoc with connective tissue dz/ obesity with possible GERD rx with diet as a first start  January 09, 2009 ov for PFT's c/o knees > sob with activity, beginning to lose wt, observing GERD diet. No assoc cough/hoarseness or  increase sob over baseline.  pft's more c/w obesity than ild and no desat walking.  rec no change in rx, wt loss key.  May 19, 2009 ov continues to be more limited by aches and pains, walking with cane, not limited by sob. no cough, so no change in recs.  July 18, 2009 more tired than sob,  limited more by knees and back. No cough.   December 31, 2009 ov f/u ild > rec yearly cxr  February 11, 2011 ov f/u for ild/ RLL spn with no change in doe, not really limiting, with arthritis intermittently flaring but no cough.  Pt denies any significant sore throat, dysphagia, itching, sneezing,  nasal congestion or excess secretions,  fever, chills, sweats, unintended wt loss, pleuritic or exertional cp, hempoptysis, change in activity tolerance  orthopnea pnd or leg swelling Pt also denies any obvious fluctuation in symptoms with weather or environmental change or other alleviating or  aggravating factors.          Current Medications (verified): 1)  Levothyroxine Sodium 125 Mcg Tabs (Levothyroxine Sodium) .Marland Kitchen.. 1 Once Daily 2)  Amlodipine Besylate 5 Mg Tabs (Amlodipine Besylate) .Marland Kitchen.. 1 Once Daily 3)  Diclofenac Sodium 75 Mg Tbec (Diclofenac Sodium) .Marland Kitchen.. 1 Two Times A Day 4)  Lipitor 10 Mg Tabs (Atorvastatin Calcium) .Marland Kitchen.. 1 At Bedtime  Allergies (verified): No Known Drug Allergies  Past History:  Past Medical History: MORBID OBESITY (ICD-278.01)     - Target wt  =  163 for BMI < 30 , wt =145 when quit smoking CONNECTIVE TISSUE DISEASE (ICD-710.9)    - Dx ?  RA     - PFTs 01/09/09    VC 88%   DLCO 66% ERV 41%    - PFT's 07/18/09 VC 93 %  DLC0 70%  ERV 17%    - Walking sats   =  ok x 3laps    February 11, 2011  HYPOTHYROIDISM (ICD-244.9) HYPERTENSION (ICD-401.9) ALLERGIC RHINITIS (ICD-477.9) Pulmonary nodules     - LUL Bx 03/22/97 Edwyna Shell) =  inflammatory with predom lymphocytes and plasma cells, no vasculitis    -  No change on cxr 11/23/2010  Vital Signs:  Patient profile:   55 year old female Weight:      209 pounds BMI:     37.16 O2 Sat:  98 % on Room air Temp:     98.1 degrees F oral Pulse rate:   83 / minute BP sitting:   122 / 82  (left arm)  Vitals Entered By: Vernie Murders (February 11, 2011 10:25 AM)  O2 Flow:  Room air  Serial Vital Signs/Assessments:  Comments: 11:29 AM Ambulatory Pulse Oximetry  Resting; HR__119___    02 Sat__98%ra___  Lap1 (185 feet)   HR__132___   02 Sat__94%ra___ Lap2 (185 feet)   HR__117___   02 Sat__95%ra___    Lap3 (185 feet)   HR__117___   02 Sat__95%ra___  _x__Test Completed without Difficulty ___Test Stopped due to:   By: Vernie Murders    Physical Exam  Additional Exam:  wt 220 December 10, 2008   > 216 December 31, 2009 > 209 February 11, 2011  Ambulatory obese bf  in no acute distress, walks slow with cane HEENT: nl  turbinates, and orophanx. Nl external ear canals without cough reflex.  edentulous Neck without JVD/Nodes/TM Lungs clear to A and P bilaterally without cough on insp or exp maneuvers, no sign crackles on insp either base RRR no s3 or murmur or increase in P2 no edema Abd soft and benign with nl excursion in the supine position. No bruits or organomegaly Ext warm without calf tenderness, cyanosis clubbing Skin warm and dry without lesions     CXR  Procedure date:  11/23/2010  Findings:      No change mild PF pattern nor in the RLL SPN since 12/11/2007  Impression & Recommendations:  Problem # 1:  PULMONARY FIBROSIS ILD POST INFLAMMATORY CHRONIC (ICD-515) DDx for pulmonary fibrosis   includes idiopathic pulmonary fibrosis, pulmonary fibrosis associated with rheumatologic disease, adverse effect from  drugs such as chemotherapy or amiodarone exposure, nonspecific interstitial pneumonia which is typically steroid responsive, and chronic hypersensitivity pneumonitis.  The relatively benign course here is typical of connective tisse dz which is not really all that well characterized by rheumatology w/u but if anything improving over time with conservative rx.  F/u yearly cxr is all that's needed here unless limiting sob or cough    Problem # 2:  PULMONARY NODULE (ICD-518.89)  No change x 2.5 years so radiographically benign, f/u yearly  Orders: Est. Patient Level IV (16109)  Medications Added to Medication List This Visit: 1)  Lipitor 10 Mg Tabs (Atorvastatin calcium) .Marland Kitchen.. 1 at bedtime  Other Orders: T-2 View CXR (71020TC) Pulse Oximetry, Ambulatory (60454)  Patient Instructions: 1)  All I recommend at this point is yearly cxr unless you notice a decline in activity tolerance related to breathing 2)  Copy sent to: Dr Maurice Small

## 2011-02-22 ENCOUNTER — Ambulatory Visit
Admission: RE | Admit: 2011-02-22 | Discharge: 2011-02-22 | Disposition: A | Discharge status: Home / Self Care | Payer: Medicare Other | Source: Ambulatory Visit | Attending: Family Medicine | Admitting: Family Medicine

## 2011-02-22 DIAGNOSIS — Z1231 Encounter for screening mammogram for malignant neoplasm of breast: Secondary | ICD-10-CM

## 2011-04-20 NOTE — Op Note (Signed)
[name], ODLE                 ACCOUNT NO.:  [id number]   MEDICAL RECORD NO.:  [id number]          PATIENT TYPE:  INP   LOCATION:  3172                         FACILITY:  [facility]   PHYSICIAN:  Danae Orleans. Venetia Maxon, M.D.  DATE OF BIRTH:  Nov 18, 1956   DATE OF PROCEDURE:  12/12/2007  DATE OF DISCHARGE:                               OPERATIVE REPORT   PREOPERATIVE DIAGNOSES:  1. L4-5 spondylolisthesis.  2. L4-5 stenosis.  3. Bilateral synovial cysts L4-5.  4. Lumbar spondylosis.  5. Lumbar radiculopathy.   POSTOPERATIVE DIAGNOSIS:  1. L4-5 spondylolisthesis.  2. L4-5 stenosis.  3. Bilateral synovial cysts, L4-5.  4. Lumbar spondylosis.  5. Lumbar radiculopathy.   PROCEDURES:  1. Bilateral laminectomy, L4-5.  2. Resection of synovial cyst.  3. Diskectomy, L4-5.  4. Transforaminal lumbar interbody fusion, L4-5 with a PEEK cage, bone      morphogenic protein, Actifuse, and morcellized bone autograft.  5. Nonsegmental pedicle screw instrumentation, L4 through L5      bilaterally.  6. Posterolateral arthrodesis with morcellized bone autograft, BMP an      Actifuse.  7. EMG monitoring.   SURGEON:  Danae Orleans. Venetia Maxon, M.D.   ASSISTANT:  Georgiann Cocker, R.N., and Clydene Fake, M.D.   ANESTHESIA:  General endotracheal anesthesia.   ESTIMATED BLOOD LOSS:  100 mL.   COMPLICATIONS:  None.   DISPOSITION:  Recovery.   INDICATIONS:  Karla Erickson is a 55 year old woman with bilateral L5  radiculopathies with mobile spondylolisthesis of L4 and L5 with synovial  cysts and spondylosis.  It was elected take her to surgery for  decompression and fusion at the L4-5 level.   DESCRIPTION OF PROCEDURE:  Karla Erickson was brought to the operating room.  Following the satisfactory and uncomplicated induction of general  endotracheal anesthesia plus intravenous lines, electrodes were placed  in her legs covering her lumbar dermatomes for EMG monitoring her.  She  was then placed prone on the [place]  table.  Her low back was then  prepped and draped in the usual sterile fashion.  The area of planned  incision was infiltrated with 0.25% Marcaine and 0.5% lidocaine with  1:200,000 epinephrine.  An incision was made in the midline and carried  through copious adipose tissue to the lumbodorsal lumbodorsal fascia  which was incised bilaterally.  Subperiosteal dissection was performed  exposing the L4 and L5 transverse processes bilaterally.  Self-retaining  retractor was placed to facilitate exposure.  After confirmatory  radiograph was obtained, a Baxano interspace distractor was then placed  to open up the interspace and the ligamentous tissue was removed in a  piecemeal fashion overlying at the L4-5 interspace, and subsequently  both stimulating electrode as well as a variety of ball-tip and Penfield  #4 dissector tip probes were inserted along the course of the L5 nerves  and out the neural foramina both ipsilaterally and contralaterally.  Subsequently, further bony removal was performed, particularly on the  left which was the patient's most symptomatic side, exposing the  interspace and decompressing it at the L4-L5 nerve roots.  There were  synovial cysts which were removed bilaterally overlying the L4-5 joints.  The laminectomy was also performed on the right side to decompress the  L4 and the L5 nerve roots.  Using  D'Errico nerve retractor, the common  dural tube was mobilized medially.  The interspace was incised and disk  material was removed in piecemeal fashion.  Endplates were then stripped  of residual disk material from the left side of the midline.  After  trial sizing and the endplate preparation, a 12-mm large PEEK interbody  cage was selected, packed with morcellized bone autograft, and also a  small kit of bone morphogenic protein.  Additionally, Actifuse was used  as bone graft extender.  The PEEK cage was placed in the interspace and  countersunk appropriately.  The  facet joints and posterolateral region  were then prepared for bone grafting.  Pedicle screws were placed 45 mm  x 5.5 mm at L5, 45 x 5.5 mm on the right at L4, and 50 x 5.5 mm on the  left at L4.  The right L4 pedicle screw was laterally oriented and this  was repositioned.  All screw positioning was confirmed on AP and lateral  fluoroscopy.  Thirty-five millimeter pre-lordosed rods were placed and  locked down in situ.  Remaining bone graft and BNP was then placed in  the posterolateral region.  The nerve roots were well-decompressed.  There were no EMG perturbations during the surgery suggestive of nerve  root irritation.  The wound had been irrigated prior to placing the bone  graft.  The lumbodorsal fascia was then closed with 1-Vicryl sutures,  subcutaneous tissues were reapproximated 2-0 Vicryl interrupted inverted  sutures, and skin edges were reapproximated with interrupted 3-0 Vicryl  subcuticular stitch.  The wound was dressed with Benzoin, Steri-Strips,  Telfa gauze, and tape.  The patient was extubated in the operating room  and taken to the recovery room, having tolerated her surgery well.      Danae Orleans. Venetia Maxon, M.D.  Electronically Signed     JDS/MEDQ  D:  12/12/2007  T:  12/12/2007  Job:  [id number]

## 2011-04-23 NOTE — Discharge Summary (Signed)
[name], Karla Erickson                 ACCOUNT NO.:  [id number]   MEDICAL RECORD NO.:  [id number]          PATIENT TYPE:  INP   LOCATION:  3014                         FACILITY:  [facility]   PHYSICIAN:  Danae Orleans. Venetia Maxon, M.D.  DATE OF BIRTH:  1956/04/11   DATE OF ADMISSION:  12/12/2007  DATE OF DISCHARGE:  12/15/2007                               DISCHARGE SUMMARY   REASON FOR ADMISSION:  1. Lumbar spondylolisthesis.  2. Lumbosacral spondylosis.  3. Synovial cyst, not otherwise specified.  4. Tachycardia, not otherwise specified.  5. Hypertension, not otherwise specified.   FINAL DIAGNOSES:  1. Lumbar spondylolisthesis.  2. Lumbosacral spondylosis.  3. Synovial cyst, not otherwise specified.  4. Tachycardia, not otherwise specified.  5. Hypertension, not otherwise specified.   HISTORY OF ILLNESS/HOSPITAL COURSE:  Karla Erickson is a 55 year old woman  with lumbar spondylolisthesis of L4-L5.  She underwent bilateral  laminectomies of L4-L5 with resection of synovial cyst diskectomy,  transforaminal lumbar interbody fusion at L4-L5 with pedicle screws and  with EMG monitoring.  She did well with this procedure and was gradually  mobilized.  Postoperatively, had relief of her back and leg pain.  Was  doing well as of December 14, 2007 and was discharged home.   DISCHARGE MEDICATIONS:  Percocet for pain relief.   FOLLOW UP:  Instructions to follow up in the office in 3 weeks for  recheck.      Danae Orleans. Venetia Maxon, M.D.  Electronically Signed     JDS/MEDQ  D:  01/09/2008  T:  01/09/2008  Job:  [id number]

## 2011-08-26 LAB — CBC
HCT: 38.7
Hemoglobin: 13.1
MCHC: 33.9
MCV: 90
Platelets: 302
RBC: 4.3
RDW: 12.9
WBC: 5.5

## 2011-08-26 LAB — TYPE AND SCREEN
ABO/RH(D): B NEG
Antibody Screen: NEGATIVE

## 2011-08-26 LAB — BASIC METABOLIC PANEL
BUN: 5 — ABNORMAL LOW
CO2: 28
Calcium: 9.8
Chloride: 105
Creatinine, Ser: 0.9
GFR calc Af Amer: >60
GFR calc non Af Amer: >60
Glucose, Bld: 92
Potassium: 4.2
Sodium: 137

## 2011-08-26 LAB — ABO/RH: ABO/RH(D): B NEG

## 2012-05-11 ENCOUNTER — Other Ambulatory Visit: Payer: Self-pay | Admitting: Family Medicine

## 2012-05-11 ENCOUNTER — Ambulatory Visit
Admission: RE | Admit: 2012-05-11 | Discharge: 2012-05-11 | Disposition: A | Discharge status: Home / Self Care | Payer: Medicare Other | Source: Ambulatory Visit | Attending: Family Medicine | Admitting: Family Medicine

## 2012-05-11 DIAGNOSIS — R911 Solitary pulmonary nodule: Secondary | ICD-10-CM

## 2012-05-15 ENCOUNTER — Other Ambulatory Visit: Payer: Self-pay | Admitting: Family Medicine

## 2012-05-15 DIAGNOSIS — Z1231 Encounter for screening mammogram for malignant neoplasm of breast: Secondary | ICD-10-CM

## 2012-06-15 ENCOUNTER — Ambulatory Visit
Admission: RE | Admit: 2012-06-15 | Discharge: 2012-06-15 | Disposition: A | Discharge status: Home / Self Care | Payer: Medicare Other | Source: Ambulatory Visit | Attending: Family Medicine | Admitting: Family Medicine

## 2012-06-15 DIAGNOSIS — Z1231 Encounter for screening mammogram for malignant neoplasm of breast: Secondary | ICD-10-CM

## 2012-07-28 ENCOUNTER — Ambulatory Visit: Payer: Medicare Other | Admitting: Internal Medicine

## 2012-08-09 ENCOUNTER — Ambulatory Visit (INDEPENDENT_AMBULATORY_CARE_PROVIDER_SITE_OTHER): Payer: Medicare Other | Admitting: Internal Medicine

## 2012-08-09 ENCOUNTER — Encounter: Payer: Self-pay | Admitting: Internal Medicine

## 2012-08-09 VITALS — BP 140/82 | HR 103 | Temp 98.7°F | Ht 63.0 in | Wt 213.0 lb

## 2012-08-09 DIAGNOSIS — R079 Chest pain, unspecified: Secondary | ICD-10-CM

## 2012-08-09 DIAGNOSIS — J8410 Pulmonary fibrosis, unspecified: Secondary | ICD-10-CM

## 2012-08-09 NOTE — Progress Notes (Signed)
Subjective:     Patient ID: Karla Erickson, female   DOB: [birth year], 55 y.o.   MRN: [id number]  HPI  34 yobf quit smoking 1998 due to "strong words from the doctor" after lung bx by Karla Erickson showing nonspecific inflammation in a left upper lobe nodule.  Subsequently seen by Karla Erickson, at [facility] with dx of connective tissue dx, RA like, with lung involvement.   December 10, 2008 seen as consult for Karla Erickson for doe x worse since 1998 assoc with 100 lb of weight gain to point now where can still do grocery store but not the malls, limited also by knees Also occ wake up and can't breath x sev years. No itching or sneezing. No hemoptysis or sign cough. impression was this was pf assoc with connective tissue dz/ obesity with possible GERD rx with diet as a first start   January 09, 2009 ov for PFT's c/o knees > sob with activity, beginning to lose wt, observing GERD diet. No assoc cough/hoarseness or increase sob over baseline. pft's more c/w obesity than ild and no desat walking. rec no change in rx, wt loss key.   May 19, 2009 ov continues to be more limited by aches and pains, walking with cane, not limited by sob. no cough, so no change in recs.   July 18, 2009 more tired than sob, limited more by knees and back. No cough.   December 31, 2009 ov f/u ild > rec yearly cxr   February 11, 2011 ov f/u for ild/ RLL spn with no change in doe, not really limiting, with arthritis intermittently flaring but no cough Rec  F/u cxr yearly  08/09/2012 f/u ov/Karla Erickson cc fleeting midline ant cp x 3 months from a few secs to as much as 5 seconds typically sitting, never wakes from sleep, no radiation or assoc nausea, diaphoresis,  no dysphagia, no pleuritic quality.  No assoc change in sob or unusual cough, purulent sputum or sinus/hb symptoms on present rx.   Sleeping ok without nocturnal  or early am exacerbation  of respiratory  c/o's or need for noct saba. Also denies any obvious fluctuation of symptoms  with weather or environmental changes or other aggravating or alleviating factors except as outlined above   ROS  The following are not active complaints unless bolded sore throat, dysphagia, dental problems, itching, sneezing,  nasal congestion or excess/ purulent secretions, ear ache,   fever, chills, sweats, unintended wt loss, pleuritic or exertional cp, hemoptysis,  orthopnea pnd or leg swelling, presyncope, palpitations, heartburn, abdominal pain, anorexia, nausea, vomiting, diarrhea  or change in bowel or urinary habits, change in stools or urine, dysuria,hematuria,  rash, arthralgias, visual complaints, headache, numbness weakness or ataxia or problems with walking or coordination,  change in mood/affect or memory.        Past Medical History:  MORBID OBESITY (ICD-278.01)  - Target wt = 163 for BMI < 30 , wt =145 when quit smoking  CONNECTIVE TISSUE DISEASE (ICD-710.9)  - Dx ? RA  - PFTs 01/09/09 VC 88% DLCO 66% ERV 41%  - PFT's 07/18/09 VC 93 % DLC0 70% ERV 17%  - Walking sats = ok x 3laps February 11, 2011  HYPOTHYROIDISM (ICD-244.9)  HYPERTENSION (ICD-401.9)  ALLERGIC RHINITIS (ICD-477.9)  Pulmonary nodules  - LUL Bx 03/22/97 Karla Erickson) = inflammatory with predom lymphocytes and plasma cells, no vasculitis  - No change on cxr 11/23/2010         Review of Systems  Objective:   Physical Exam     wt 220 December 10, 2008 > 216 December 31, 2009 > 209 February 11, 2011 > 08/09/2012 213  Ambulatory obese bf in no acute distress, walks slow with cane  HEENT: nl turbinates, and orophanx. Nl external ear canals without cough reflex. edentulous  Neck without JVD/Nodes/TM  Lungs clear to A and P bilaterally without cough on insp or exp maneuvers, no sign crackles on insp either base  RRR no s3 or murmur or increase in P2 no edema  Abd soft and benign with nl excursion in the supine position. No bruits or organomegaly  Ext warm without calf tenderness, cyanosis clubbing  Skin warm and dry  without lesions  05/11/12 cxr No acute disease. No significant change. Stable postsurgical  changes left hemithorax. Stable linear scarring in the lingula.  Stable nodular scarring left midlung laterally.  Stable nodular opacity right base medially without change in size  and appearance from prior exam.  Assessment:          Plan:

## 2012-08-09 NOTE — Patient Instructions (Addendum)
Citrucel powder one heaping tsp twice daily with glass of water juice x one month - if pain not pain improved return  Also avoid that you know causes gas like salads, undercooked veggies, boiled eggs and especially beans  No calcium carbonate - wait a month then ok to start calcium citrate  The problem with you swallowing may related to your norvasc - consider changing to alternative (diovan or bisoprolol) per Dr Maurice Small   If you are satisfied with your treatment plan let your doctor know and he/she can either refill your medications or you can return here when your prescription runs out.     If in any way you are not 100% satisfied,  please tell us.  If 100% better, tell your friends!

## 2012-08-10 DIAGNOSIS — R079 Chest pain, unspecified: Secondary | ICD-10-CM | POA: Insufficient documentation

## 2012-08-10 NOTE — Assessment & Plan Note (Signed)

## 2012-08-10 NOTE — Assessment & Plan Note (Signed)
Not limited by breathing at this point, no further regular f/u planned

## 2013-06-18 ENCOUNTER — Other Ambulatory Visit: Payer: Self-pay

## 2013-06-18 DIAGNOSIS — Z1231 Encounter for screening mammogram for malignant neoplasm of breast: Secondary | ICD-10-CM

## 2013-07-12 ENCOUNTER — Ambulatory Visit
Admission: RE | Admit: 2013-07-12 | Discharge: 2013-07-12 | Disposition: A | Discharge status: Home / Self Care | Payer: Medicare Other | Source: Ambulatory Visit

## 2013-07-12 DIAGNOSIS — Z1231 Encounter for screening mammogram for malignant neoplasm of breast: Secondary | ICD-10-CM

## 2013-12-17 ENCOUNTER — Ambulatory Visit
Admission: RE | Admit: 2013-12-17 | Discharge: 2013-12-17 | Disposition: A | Discharge status: Home / Self Care | Payer: Medicare HMO | Source: Ambulatory Visit | Attending: Family Medicine | Admitting: Family Medicine

## 2013-12-17 ENCOUNTER — Other Ambulatory Visit: Payer: Self-pay | Admitting: Family Medicine

## 2013-12-17 DIAGNOSIS — R911 Solitary pulmonary nodule: Secondary | ICD-10-CM

## 2013-12-21 ENCOUNTER — Other Ambulatory Visit: Payer: Self-pay | Admitting: Family Medicine

## 2013-12-21 DIAGNOSIS — R9389 Abnormal findings on diagnostic imaging of other specified body structures: Secondary | ICD-10-CM

## 2013-12-26 ENCOUNTER — Other Ambulatory Visit: Payer: Medicare HMO

## 2014-02-20 ENCOUNTER — Other Ambulatory Visit: Payer: Medicare HMO

## 2014-04-15 ENCOUNTER — Other Ambulatory Visit: Payer: Medicare HMO

## 2014-06-27 ENCOUNTER — Other Ambulatory Visit: Payer: Self-pay

## 2014-06-27 DIAGNOSIS — Z1231 Encounter for screening mammogram for malignant neoplasm of breast: Secondary | ICD-10-CM

## 2014-07-15 ENCOUNTER — Ambulatory Visit
Admission: RE | Admit: 2014-07-15 | Discharge: 2014-07-15 | Disposition: A | Discharge status: Home / Self Care | Payer: Commercial Managed Care - HMO | Source: Ambulatory Visit

## 2014-07-15 DIAGNOSIS — Z1231 Encounter for screening mammogram for malignant neoplasm of breast: Secondary | ICD-10-CM

## 2014-07-17 ENCOUNTER — Other Ambulatory Visit: Payer: Self-pay | Admitting: Family Medicine

## 2014-07-17 DIAGNOSIS — R928 Other abnormal and inconclusive findings on diagnostic imaging of breast: Secondary | ICD-10-CM

## 2014-07-23 ENCOUNTER — Ambulatory Visit
Admission: RE | Admit: 2014-07-23 | Discharge: 2014-07-23 | Disposition: A | Discharge status: Home / Self Care | Payer: Commercial Managed Care - HMO | Source: Ambulatory Visit | Attending: Family Medicine | Admitting: Family Medicine

## 2014-07-23 DIAGNOSIS — R928 Other abnormal and inconclusive findings on diagnostic imaging of breast: Secondary | ICD-10-CM

## 2014-12-23 DIAGNOSIS — E039 Hypothyroidism, unspecified: Secondary | ICD-10-CM | POA: Diagnosis not present

## 2015-02-07 DIAGNOSIS — E780 Pure hypercholesterolemia: Secondary | ICD-10-CM | POA: Diagnosis not present

## 2015-02-07 DIAGNOSIS — E119 Type 2 diabetes mellitus without complications: Secondary | ICD-10-CM | POA: Diagnosis not present

## 2015-02-07 DIAGNOSIS — I10 Essential (primary) hypertension: Secondary | ICD-10-CM | POA: Diagnosis not present

## 2015-02-11 ENCOUNTER — Other Ambulatory Visit: Payer: Self-pay | Admitting: Family Medicine

## 2015-02-11 DIAGNOSIS — N63 Unspecified lump in unspecified breast: Secondary | ICD-10-CM

## 2015-02-20 ENCOUNTER — Ambulatory Visit
Admission: RE | Admit: 2015-02-20 | Discharge: 2015-02-20 | Disposition: A | Discharge status: Home / Self Care | Payer: Commercial Managed Care - HMO | Source: Ambulatory Visit | Attending: Family Medicine | Admitting: Family Medicine

## 2015-02-20 DIAGNOSIS — N63 Unspecified lump in unspecified breast: Secondary | ICD-10-CM

## 2015-07-17 ENCOUNTER — Other Ambulatory Visit: Payer: Self-pay | Admitting: Family Medicine

## 2015-07-17 DIAGNOSIS — N6320 Unspecified lump in the left breast, unspecified quadrant: Secondary | ICD-10-CM

## 2015-07-29 ENCOUNTER — Other Ambulatory Visit: Payer: Self-pay

## 2015-07-29 DIAGNOSIS — M545 Low back pain: Secondary | ICD-10-CM | POA: Diagnosis not present

## 2015-07-29 DIAGNOSIS — M7072 Other bursitis of hip, left hip: Secondary | ICD-10-CM | POA: Diagnosis not present

## 2015-08-01 ENCOUNTER — Other Ambulatory Visit: Payer: Self-pay

## 2015-08-08 ENCOUNTER — Ambulatory Visit
Admission: RE | Admit: 2015-08-08 | Discharge: 2015-08-08 | Disposition: A | Discharge status: Home / Self Care | Payer: Commercial Managed Care - HMO | Source: Ambulatory Visit | Attending: Family Medicine | Admitting: Family Medicine

## 2015-08-08 DIAGNOSIS — N6320 Unspecified lump in the left breast, unspecified quadrant: Secondary | ICD-10-CM

## 2015-08-13 DIAGNOSIS — Z0000 Encounter for general adult medical examination without abnormal findings: Secondary | ICD-10-CM | POA: Diagnosis not present

## 2015-08-13 DIAGNOSIS — R911 Solitary pulmonary nodule: Secondary | ICD-10-CM | POA: Diagnosis not present

## 2015-08-13 DIAGNOSIS — E119 Type 2 diabetes mellitus without complications: Secondary | ICD-10-CM | POA: Diagnosis not present

## 2015-08-13 DIAGNOSIS — M1990 Unspecified osteoarthritis, unspecified site: Secondary | ICD-10-CM | POA: Diagnosis not present

## 2015-08-13 DIAGNOSIS — E039 Hypothyroidism, unspecified: Secondary | ICD-10-CM | POA: Diagnosis not present

## 2015-08-13 DIAGNOSIS — E780 Pure hypercholesterolemia: Secondary | ICD-10-CM | POA: Diagnosis not present

## 2015-08-13 DIAGNOSIS — E669 Obesity, unspecified: Secondary | ICD-10-CM | POA: Diagnosis not present

## 2015-08-13 DIAGNOSIS — I10 Essential (primary) hypertension: Secondary | ICD-10-CM | POA: Diagnosis not present

## 2015-08-27 DIAGNOSIS — M859 Disorder of bone density and structure, unspecified: Secondary | ICD-10-CM | POA: Diagnosis not present

## 2015-08-27 DIAGNOSIS — M8589 Other specified disorders of bone density and structure, multiple sites: Secondary | ICD-10-CM | POA: Diagnosis not present

## 2015-09-01 ENCOUNTER — Other Ambulatory Visit: Payer: Self-pay

## 2015-09-03 ENCOUNTER — Ambulatory Visit
Admission: RE | Admit: 2015-09-03 | Discharge: 2015-09-03 | Disposition: A | Discharge status: Home / Self Care | Payer: Commercial Managed Care - HMO | Source: Ambulatory Visit | Attending: Family Medicine | Admitting: Family Medicine

## 2015-09-03 DIAGNOSIS — N63 Unspecified lump in breast: Secondary | ICD-10-CM | POA: Diagnosis not present

## 2015-09-03 DIAGNOSIS — R928 Other abnormal and inconclusive findings on diagnostic imaging of breast: Secondary | ICD-10-CM | POA: Diagnosis not present

## 2016-02-27 DIAGNOSIS — E119 Type 2 diabetes mellitus without complications: Secondary | ICD-10-CM | POA: Diagnosis not present

## 2016-02-27 DIAGNOSIS — E039 Hypothyroidism, unspecified: Secondary | ICD-10-CM | POA: Diagnosis not present

## 2016-02-27 DIAGNOSIS — E7800 Pure hypercholesterolemia, unspecified: Secondary | ICD-10-CM | POA: Diagnosis not present

## 2016-02-27 DIAGNOSIS — I10 Essential (primary) hypertension: Secondary | ICD-10-CM | POA: Diagnosis not present

## 2016-08-12 ENCOUNTER — Other Ambulatory Visit: Payer: Self-pay | Admitting: Family Medicine

## 2016-08-12 DIAGNOSIS — N6320 Unspecified lump in the left breast, unspecified quadrant: Secondary | ICD-10-CM

## 2016-09-03 ENCOUNTER — Ambulatory Visit
Admission: RE | Admit: 2016-09-03 | Discharge: 2016-09-03 | Disposition: A | Discharge status: Home / Self Care | Payer: Commercial Managed Care - HMO | Source: Ambulatory Visit | Attending: Family Medicine | Admitting: Family Medicine

## 2016-09-03 DIAGNOSIS — N6320 Unspecified lump in the left breast, unspecified quadrant: Secondary | ICD-10-CM

## 2016-09-03 DIAGNOSIS — N63 Unspecified lump in breast: Secondary | ICD-10-CM | POA: Diagnosis not present

## 2016-09-29 DIAGNOSIS — Z23 Encounter for immunization: Secondary | ICD-10-CM | POA: Diagnosis not present

## 2016-12-28 DIAGNOSIS — E7800 Pure hypercholesterolemia, unspecified: Secondary | ICD-10-CM | POA: Diagnosis not present

## 2016-12-28 DIAGNOSIS — E119 Type 2 diabetes mellitus without complications: Secondary | ICD-10-CM | POA: Diagnosis not present

## 2016-12-28 DIAGNOSIS — R911 Solitary pulmonary nodule: Secondary | ICD-10-CM | POA: Diagnosis not present

## 2016-12-28 DIAGNOSIS — M1990 Unspecified osteoarthritis, unspecified site: Secondary | ICD-10-CM | POA: Diagnosis not present

## 2016-12-28 DIAGNOSIS — M797 Fibromyalgia: Secondary | ICD-10-CM | POA: Diagnosis not present

## 2016-12-28 DIAGNOSIS — Z124 Encounter for screening for malignant neoplasm of cervix: Secondary | ICD-10-CM | POA: Diagnosis not present

## 2016-12-28 DIAGNOSIS — I10 Essential (primary) hypertension: Secondary | ICD-10-CM | POA: Diagnosis not present

## 2016-12-28 DIAGNOSIS — E039 Hypothyroidism, unspecified: Secondary | ICD-10-CM | POA: Diagnosis not present

## 2016-12-28 DIAGNOSIS — Z0000 Encounter for general adult medical examination without abnormal findings: Secondary | ICD-10-CM | POA: Diagnosis not present

## 2016-12-28 DIAGNOSIS — E669 Obesity, unspecified: Secondary | ICD-10-CM | POA: Diagnosis not present

## 2017-01-12 ENCOUNTER — Other Ambulatory Visit: Payer: Self-pay | Admitting: Family Medicine

## 2017-01-12 ENCOUNTER — Ambulatory Visit
Admission: RE | Admit: 2017-01-12 | Discharge: 2017-01-12 | Disposition: A | Discharge status: Home / Self Care | Payer: Medicare HMO | Source: Ambulatory Visit | Attending: Family Medicine | Admitting: Family Medicine

## 2017-01-12 DIAGNOSIS — R911 Solitary pulmonary nodule: Secondary | ICD-10-CM

## 2017-02-03 DIAGNOSIS — M5412 Radiculopathy, cervical region: Secondary | ICD-10-CM | POA: Diagnosis not present

## 2017-06-27 DIAGNOSIS — E119 Type 2 diabetes mellitus without complications: Secondary | ICD-10-CM | POA: Diagnosis not present

## 2017-06-27 DIAGNOSIS — E039 Hypothyroidism, unspecified: Secondary | ICD-10-CM | POA: Diagnosis not present

## 2017-06-27 DIAGNOSIS — E1165 Type 2 diabetes mellitus with hyperglycemia: Secondary | ICD-10-CM | POA: Diagnosis not present

## 2017-06-27 DIAGNOSIS — I10 Essential (primary) hypertension: Secondary | ICD-10-CM | POA: Diagnosis not present

## 2017-06-27 DIAGNOSIS — E7800 Pure hypercholesterolemia, unspecified: Secondary | ICD-10-CM | POA: Diagnosis not present

## 2017-08-17 ENCOUNTER — Other Ambulatory Visit: Payer: Self-pay | Admitting: Family Medicine

## 2017-08-17 DIAGNOSIS — Z1231 Encounter for screening mammogram for malignant neoplasm of breast: Secondary | ICD-10-CM

## 2017-09-05 ENCOUNTER — Ambulatory Visit
Admission: RE | Admit: 2017-09-05 | Discharge: 2017-09-05 | Disposition: A | Discharge status: Home / Self Care | Payer: Medicare HMO | Source: Ambulatory Visit | Attending: Family Medicine | Admitting: Family Medicine

## 2017-09-05 DIAGNOSIS — Z1231 Encounter for screening mammogram for malignant neoplasm of breast: Secondary | ICD-10-CM | POA: Diagnosis not present

## 2017-12-30 DIAGNOSIS — Z0000 Encounter for general adult medical examination without abnormal findings: Secondary | ICD-10-CM | POA: Diagnosis not present

## 2017-12-30 DIAGNOSIS — Z1389 Encounter for screening for other disorder: Secondary | ICD-10-CM | POA: Diagnosis not present

## 2017-12-30 DIAGNOSIS — M25519 Pain in unspecified shoulder: Secondary | ICD-10-CM | POA: Diagnosis not present

## 2017-12-30 DIAGNOSIS — E7800 Pure hypercholesterolemia, unspecified: Secondary | ICD-10-CM | POA: Diagnosis not present

## 2017-12-30 DIAGNOSIS — Z6841 Body Mass Index (BMI) 40.0 and over, adult: Secondary | ICD-10-CM | POA: Diagnosis not present

## 2017-12-30 DIAGNOSIS — R911 Solitary pulmonary nodule: Secondary | ICD-10-CM | POA: Diagnosis not present

## 2017-12-30 DIAGNOSIS — M797 Fibromyalgia: Secondary | ICD-10-CM | POA: Diagnosis not present

## 2017-12-30 DIAGNOSIS — E039 Hypothyroidism, unspecified: Secondary | ICD-10-CM | POA: Diagnosis not present

## 2017-12-30 DIAGNOSIS — E119 Type 2 diabetes mellitus without complications: Secondary | ICD-10-CM | POA: Diagnosis not present

## 2017-12-30 DIAGNOSIS — M1990 Unspecified osteoarthritis, unspecified site: Secondary | ICD-10-CM | POA: Diagnosis not present

## 2017-12-30 DIAGNOSIS — E669 Obesity, unspecified: Secondary | ICD-10-CM | POA: Diagnosis not present

## 2018-02-14 DIAGNOSIS — M19011 Primary osteoarthritis, right shoulder: Secondary | ICD-10-CM | POA: Diagnosis not present

## 2018-06-29 DIAGNOSIS — E7800 Pure hypercholesterolemia, unspecified: Secondary | ICD-10-CM | POA: Diagnosis not present

## 2018-06-29 DIAGNOSIS — E119 Type 2 diabetes mellitus without complications: Secondary | ICD-10-CM | POA: Diagnosis not present

## 2018-06-29 DIAGNOSIS — Z6841 Body Mass Index (BMI) 40.0 and over, adult: Secondary | ICD-10-CM | POA: Diagnosis not present

## 2018-06-29 DIAGNOSIS — I10 Essential (primary) hypertension: Secondary | ICD-10-CM | POA: Diagnosis not present

## 2018-06-29 DIAGNOSIS — E039 Hypothyroidism, unspecified: Secondary | ICD-10-CM | POA: Diagnosis not present

## 2018-07-18 ENCOUNTER — Other Ambulatory Visit: Payer: Self-pay | Admitting: Family Medicine

## 2018-07-18 DIAGNOSIS — Z1231 Encounter for screening mammogram for malignant neoplasm of breast: Secondary | ICD-10-CM

## 2018-07-24 ENCOUNTER — Other Ambulatory Visit: Payer: Self-pay | Admitting: *Deleted

## 2018-07-24 ENCOUNTER — Encounter: Payer: Self-pay | Admitting: *Deleted

## 2018-07-24 NOTE — Patient Outreach (Signed)
[facility]) Care Management  07/24/2018  Rowe RobertRoslyn P Cullins 1955/12/26 [id number]  [facility] Reason:Newly diagnosed with DM; Med assist; DM Education & supply help:  Telephone call 1# to patient; left HIPPA compliant  voice mail requesting return call.  Plan: Send outreach letter to patient. Follow up 2-4 business days.  Colleen CanLinda Shawnna Pancake, RN BSN CCM Care Management Coordinator [facility] Care [facility]  [phone]

## 2018-07-27 ENCOUNTER — Other Ambulatory Visit: Payer: Self-pay | Admitting: *Deleted

## 2018-07-27 NOTE — Patient Outreach (Addendum)
[facility]) Care Management  07/27/2018  [name] [birth year] [id number]  [facility]); Reason:Newly diagnosed with DM; Med assist; DM Education & supply help:  Telephone call to patient who was advised of reason for call & [facility] care management services.  HIPPA verification received from patient.  Patient voices that she was diagnosed with DM about 5 years ago. States she was not placed on medication at that time and was able to keep it under control. States last A1c level was 8.6 on 06/29/2018 and MD placed her on Metformin 2000 mg daily. Voices she has no problems getting prescriptions filled.   States she will be getting a new meter in 1-2 weeks. Voices that she plans to [facility] when diabetes class comes around again. Also states she may see nutritionist in MD office building. States she does not have her thyroid gland and has problem with her metabolism.   Patient was recommended [facility] RN Health Coach for diabetes management.  Patient consents to services.  Plan: Referral to [facility] RN Health Coach for disease management for Diabetes Mellitus.   Colleen CanLinda Dennise Raabe, RN BSN CCM Care Management Coordinator [facility] Care [facility]  [phone]

## 2018-08-22 NOTE — Telephone Encounter (Signed)
This encounter was created in error - please disregard.

## 2018-08-23 ENCOUNTER — Other Ambulatory Visit: Payer: Self-pay

## 2018-08-23 NOTE — Patient Outreach (Signed)
[facility]) Care Management  08/23/2018   Karla Erickson 1956/09/26 [id number]      Outreach attempt # 1 to the patient for initial assessment. HIPAA verified with patient.  Discussed and offered [facility] care management services with patient. Patient verbally agreed to services.    Social: The patient lives in the home with her husband Karla Erickson.  She states that she is able to perform her ADLS/IADLS independently.  She has transportation to her appointments. The durable medical equipment in the home consist of CBG meter,blood pressure cuff, walker cane, dentures and eyeglasses.  Conditions: Per chart review and conversation with the patient her conditions include HTN, DM type II, Hypothyroidism and pulmonary nodule.  The patient states that she is having pain I her left leg today that she rates at a 5/10.  She states that the pain travels all over her body.  The patient states that she has had one fall in the beginning of the year but did not injure herself.  She blacked out in her tub and does  Onto know why  but has not had an episode since.  She does walk with a cane for stability.  The patient has not check her blood sugar today because she states she changed the batteries and can not set the meter.  I advised the patient to get a family member or go to her pharmacy to get help.  I also explained to the patient the importance of checking her blood sugars daily. The patient states that she is monitoring her diet and trying to eat the correct foods for her diabetes.  She walks in her neighborhood to get exercise.  Medications:  The patient is on six medications.  She is able to manage her meds and did not express any concern for paying for her meds.  Appointments: The patient states that she has an appointment next month to see her physician and sees her every six months  Advanced Directives: The patient states that she has a living will.  She was unable to remember if she has  given her physician a copy.     Current Medications:  Current Outpatient Medications  Medication Sig Dispense Refill  . amLODipine (NORVASC) 5 MG tablet 1 tablet once daily    . aspirin EC 81 MG tablet Take 81 mg by mouth daily.    Marland Kitchen atorvastatin (LIPITOR) 10 MG tablet 1 tablet daily    . hydrochlorothiazide (HYDRODIURIL) 25 MG tablet Take 25 mg by mouth as needed.    Marland Kitchen levothyroxine (SYNTHROID, LEVOTHROID) 112 MCG tablet 1 tablet daily    . metFORMIN (GLUCOPHAGE) 500 MG tablet Take 500 mg by mouth daily.     No current facility-administered medications for this visit.     Functional Status:  In your present state of health, do you have any difficulty performing the following activities: 08/23/2018  Hearing? N  Vision? N  Difficulty concentrating or making decisions? N  Walking or climbing stairs? Y  Dressing or bathing? N  Doing errands, shopping? N  Some recent data might be hidden    Fall/Depression Screening: Fall Risk  08/23/2018 07/27/2018  Falls in the past year? Yes No  Number falls in past yr: 1 -  Injury with Fall? No -  Follow up Falls evaluation completed -   PHQ 2/9 Scores 08/23/2018 07/27/2018  PHQ - 2 Score 1 0    Assessment: Patient will benefit from health coach outreach for disease management and support.  [facility] CM Care Plan Problem One     Most Recent Value  Care Plan Problem One  Knowledge deficit related to disease management  Role Documenting the Problem One  Health Coach  Care Plan for Problem One  Active  [facility] Long Term Goal   in 90 days to patient will verbalize lowering her a1c of 8.9 by 1-2 points  [facility] Long Term Goal Start Date  08/23/18  Interventions for Problem One Long Term Goal  Dicussed checking blood sugrs daily, diet exercise and medication management,     Plan: RN Health Coach will provide ongoing education for patient on diabetes through phone calls and sending printed information to patient for further discussion.  RN Health Coach  will send welcome packet with consent to patient as well as printed information on diabetes  RN Health Coach will send initial barriers letter, assessment, and care plan to primary care physician  Juanell Fairlyraci Fleurette Woolbright RN, BSN, [facility] RN Health Coach Disease Management [facility] Direct Dial:  [phone]  Fax: [phone]

## 2018-09-11 ENCOUNTER — Ambulatory Visit
Admission: RE | Admit: 2018-09-11 | Discharge: 2018-09-11 | Disposition: A | Discharge status: Home / Self Care | Payer: Medicare HMO | Source: Ambulatory Visit | Attending: Family Medicine | Admitting: Family Medicine

## 2018-09-11 DIAGNOSIS — Z1231 Encounter for screening mammogram for malignant neoplasm of breast: Secondary | ICD-10-CM

## 2018-09-22 ENCOUNTER — Other Ambulatory Visit: Payer: Self-pay

## 2018-09-22 NOTE — Patient Outreach (Signed)
[facility]) Care Management  09/22/2018  Karla Erickson 1955-12-19 [id number]    Subjective: Unsuccessful telephone outreach to the patient for assessment. No answer.  HIPAA compliant voicemail left  with contact information.  Plan: RN Health Coach will make another outreach attempt to the patient within thirty days.  [name] RN, BSN, [facility] RN Health Coach Disease Management [facility] Dial:  [phone]  Fax: [phone]

## 2018-09-25 ENCOUNTER — Other Ambulatory Visit: Payer: Self-pay

## 2018-09-25 NOTE — Patient Outreach (Signed)
[facility]) Care Management  09/25/2018   Karla Erickson June 23, 1956 [id number]  Subjective:  Successful telephone outreach to the patient for assessment. HIPAA verified. Patient is monitoring blood sugars. She monitored her blood sugar last night two hours after eating and it was 123. She rechecked her blood sugar this am fasting and it was 182. Patient reports she ate roast with carrots and potatoes for supper but states that the potato chips were staring her "in the face" and she had to have some prior to bed. Pt able to verbalize that she must make better food choices but overall she feels she is doing good with her diabetes management. Patient reports taking her medications as prescribed as per her medication review. Patient reports that she is to follow up with her MD on the 30th for her diabetic blood work follow up. Patient does report indigestion after taking metformin but she is taking TUMS to relieve the indigestion. Patient also reports that the Metformin makes her "mouth dry" and she is drinking plenty of water to "fix that". Patient denies pain and falls. Patient also reports that she is going to her sister's today to "get some fresh greens" for her meals.      Current Medications:  Current Outpatient Medications  Medication Sig Dispense Refill  . amLODipine (NORVASC) 5 MG tablet 1 tablet once daily    . aspirin EC 81 MG tablet Take 81 mg by mouth daily.    Marland Kitchen atorvastatin (LIPITOR) 10 MG tablet 1 tablet daily    . hydrochlorothiazide (HYDRODIURIL) 25 MG tablet Take 25 mg by mouth as needed.    Marland Kitchen levothyroxine (SYNTHROID, LEVOTHROID) 112 MCG tablet 1 tablet daily    . metFORMIN (GLUCOPHAGE) 500 MG tablet Take 500 mg by mouth daily.     No current facility-administered medications for this visit.     Functional Status:  In your present state of health, do you have any difficulty performing the following activities: 08/23/2018  Hearing? N  Vision? N  Difficulty  concentrating or making decisions? N  Walking or climbing stairs? Y  Dressing or bathing? N  Doing errands, shopping? N  Some recent data might be hidden    Fall/Depression Screening: Fall Risk  09/25/2018 08/23/2018 07/27/2018  Falls in the past year? No Yes No  Number falls in past yr: - 1 -  Injury with Fall? - No -  Follow up - Falls evaluation completed -   PHQ 2/9 Scores 08/23/2018 07/27/2018  PHQ - 2 Score 1 0    Assessment:  Patient will continue to benefit from health coach outreach for disease management and support.  [facility] CM Care Plan Problem One     Most Recent Value  [facility] Long Term Goal   in 30 days to patient will verbalize lowering her a1c of 8.9 by 1-2 points  Interventions for Problem One Long Term Goal  Discussed with patient current diet choices, monitoring blood sugar, monitoring blood work every 3 months, and medication management       Plan:  RN Health Coach will contact patient in the month of  November and patient agrees to next outreach.  [name] RN, BSN, [facility] RN Health Coach Disease Management [facility] Dial:  [phone]  Fax: [phone]

## 2018-10-04 DIAGNOSIS — E1165 Type 2 diabetes mellitus with hyperglycemia: Secondary | ICD-10-CM | POA: Diagnosis not present

## 2018-10-11 ENCOUNTER — Ambulatory Visit: Payer: Self-pay

## 2018-10-26 ENCOUNTER — Other Ambulatory Visit: Payer: Self-pay

## 2018-10-26 NOTE — Patient Outreach (Signed)
[facility](THN) Care Management  10/26/2018   Karla RobertRoslyn P Erickson 1956-06-02 [id number]  Subjective:   Successful outreach to the patient for assessment.  HIPAA verified.  The patient states that she has been doing well.  She denies any falls but she has been having pain in her legs that she rates a 6/10.  She states that she wakes up to it daily.  She states that it feels like cramping.  She checked her blood sugar this morning and it was 138.  She states that she is trying to eat smaller portions of her food and monitor her sugar intake.  She does do a small amount of walking when her legs are not hurting.   The patient states that she received her flu shot at [employer] two weeks ago.  Current Medications:  Current Outpatient Medications  Medication Sig Dispense Refill  . amLODipine (NORVASC) 5 MG tablet 1 tablet once daily    . aspirin EC 81 MG tablet Take 81 mg by mouth daily.    Marland Kitchen. atorvastatin (LIPITOR) 10 MG tablet 1 tablet daily    . hydrochlorothiazide (HYDRODIURIL) 25 MG tablet Take 25 mg by mouth as needed.    Marland Kitchen. levothyroxine (SYNTHROID, LEVOTHROID) 112 MCG tablet 1 tablet daily    . metFORMIN (GLUCOPHAGE) 500 MG tablet Take 500 mg by mouth daily.     No current facility-administered medications for this visit.     Functional Status:  In your present state of health, do you have any difficulty performing the following activities: 08/23/2018  Hearing? N  Vision? N  Difficulty concentrating or making decisions? N  Walking or climbing stairs? Y  Dressing or bathing? N  Doing errands, shopping? N  Some recent data might be hidden    Fall/Depression Screening: Fall Risk  10/26/2018 09/25/2018 08/23/2018  Falls in the past year? 0 No Yes  Number falls in past yr: - - 1  Injury with Fall? - - No  Follow up - - Falls evaluation completed   PHQ 2/9 Scores 08/23/2018 07/27/2018  PHQ - 2 Score 1 0    Assessment: Patient will continue to benefit from health coach  outreach for disease management and support.   Plan: RN Health Coach will contact patient in the month of December and patient agrees to next outreach.   Karla Fairlyraci Jatoya Armbrister RN, BSN, [facility] RN Health Coach Disease Management [facility] Direct Dial:  [phone]  Fax: [phone]

## 2018-11-07 DIAGNOSIS — K219 Gastro-esophageal reflux disease without esophagitis: Secondary | ICD-10-CM | POA: Diagnosis not present

## 2018-11-07 DIAGNOSIS — E1169 Type 2 diabetes mellitus with other specified complication: Secondary | ICD-10-CM | POA: Diagnosis not present

## 2018-11-07 DIAGNOSIS — R252 Cramp and spasm: Secondary | ICD-10-CM | POA: Diagnosis not present

## 2018-11-24 ENCOUNTER — Other Ambulatory Visit: Payer: Self-pay

## 2018-11-24 NOTE — Patient Outreach (Signed)
[facility](THN) Care Management  11/24/2018   Karla RobertRoslyn P Erickson 1956/06/26 [id number]  Subjective: Successful outreach to the patient for assessment.  HIPAA verified.  The patient states that she is doing well.  She had an appointment with her physician about her reflux and was told to take her medications with food and Nexium 40 mg was added to her meds.  She feels a lot better with the addition.  She states that her blood sugar today was 123.  She is still monitoring her diet for carbs.  She denies any falls but continues to have arthritic pain in her back.  The patient states that she was able to walk a half mile yesterday but has to take her time.  She has her next appointment to check her diabetes will be in February 2020.   Current Medications:  Current Outpatient Medications  Medication Sig Dispense Refill  . amLODipine (NORVASC) 5 MG tablet 1 tablet once daily    . aspirin EC 81 MG tablet Take 81 mg by mouth daily.    Marland Kitchen. atorvastatin (LIPITOR) 10 MG tablet 1 tablet daily    . esomeprazole (NEXIUM) 40 MG capsule Take 40 mg by mouth daily at 12 noon.    . hydrochlorothiazide (HYDRODIURIL) 25 MG tablet Take 25 mg by mouth as needed.    Marland Kitchen. levothyroxine (SYNTHROID, LEVOTHROID) 112 MCG tablet 1 tablet daily    . metFORMIN (GLUCOPHAGE) 500 MG tablet Take 500 mg by mouth daily.     No current facility-administered medications for this visit.     Functional Status:  In your present state of health, do you have any difficulty performing the following activities: 08/23/2018  Hearing? N  Vision? N  Difficulty concentrating or making decisions? N  Walking or climbing stairs? Y  Dressing or bathing? N  Doing errands, shopping? N  Some recent data might be hidden    Fall/Depression Screening: Fall Risk  11/24/2018 10/26/2018 09/25/2018  Falls in the past year? 0 0 No  Number falls in past yr: - - -  Injury with Fall? - - -  Follow up - - -   PHQ 2/9 Scores 08/23/2018  07/27/2018  PHQ - 2 Score 1 0    Assessment: Patient will continue to benefit from health coach outreach for disease management and support. [facility] CM Care Plan Problem One     Most Recent Value  THN Long Term Goal   in 60 days to patient will verbalize lowering her a1c of 8.9 by 1-2 points  [facility] Start Date  11/24/18  Interventions for Problem One Long Term Goal  reviewedcbg readings, exercise, diet and medication adherence.       Plan: RN Health Coach will contact patient in the month of February and patient agrees to next outreach.   Juanell Fairlyraci Kenslie Abbruzzese RN, BSN, [facility] RN Health Coach Disease Management [facility] Direct Dial:  [phone]  Fax: [phone]

## 2019-01-12 ENCOUNTER — Other Ambulatory Visit: Payer: Self-pay

## 2019-01-12 NOTE — Patient Outreach (Addendum)
[facility]N) Care Management  01/12/2019   Karla Erickson [birth year] [id number]  Subjective: Successful outreach to the patient for assessment.  HIPAA verified.  The patient is doing well.  She states that she has been having pain with her back issues and rates the pain at 4/10.  She is resting. Advised the patient that she can use a heating pad or cold to help with pain.  She verbalized understanding.  The patient denies any falls.  She states that her blood sugar this morning was 160. She states that she is realizing that certain foods increase her blood sugar and the time she eats.  She states that she is taking her medications as prescribed.  She has an appointment on the 13th of this month.    Current Medications:  Current Outpatient Medications  Medication Sig Dispense Refill  . amLODipine (NORVASC) 5 MG tablet 1 tablet once daily    . aspirin EC 81 MG tablet Take 81 mg by mouth daily.    Marland Kitchen. atorvastatin (LIPITOR) 10 MG tablet 1 tablet daily    . esomeprazole (NEXIUM) 40 MG capsule Take 40 mg by mouth daily at 12 noon.    . hydrochlorothiazide (HYDRODIURIL) 25 MG tablet Take 25 mg by mouth as needed.    Marland Kitchen. levothyroxine (SYNTHROID, LEVOTHROID) 112 MCG tablet 1 tablet daily    . metFORMIN (GLUCOPHAGE) 500 MG tablet Take 500 mg by mouth daily.     No current facility-administered medications for this visit.     Functional Status:  In your present state of health, do you have any difficulty performing the following activities: 08/23/2018  Hearing? N  Vision? N  Difficulty concentrating or making decisions? N  Walking or climbing stairs? Y  Dressing or bathing? N  Doing errands, shopping? N  Some recent data might be hidden    Fall/Depression Screening: Fall Risk  01/12/2019 11/24/2018 10/26/2018  Falls in the past year? 0 0 0  Number falls in past yr: - - -  Injury with Fall? - - -  Follow up - - -   PHQ 2/9 Scores 08/23/2018 07/27/2018  PHQ - 2 Score 1 0     Assessment: Patient will continue to benefit from health coach outreach for disease management and support. [facility]N CM Care Plan Problem One     Most Recent Value  THN Long Term Goal   in 60 days to patient will verbalize lowering her a1c of 8.9 by 1-2 points  [facility] Start Date  01/12/19  Interventions for Problem One Long Term Goal  Discussed with the patient about food intake.  she is starting to realize which foods and time of eating affect her blood sugars.  she is monitoring them daily and going to her appointment as scheduled.  she takes her medications as prescribed.  (Pended)        Plan:  RN Health Coach will contact patient in the month of  April and patient agrees to next outreach.   Juanell Fairlyraci Willie Plain RN, BSN, [facility] RN Health Coach Disease Management [facility] Direct Dial:  [phone]  Fax: [phone]

## 2019-01-18 DIAGNOSIS — E039 Hypothyroidism, unspecified: Secondary | ICD-10-CM | POA: Diagnosis not present

## 2019-01-18 DIAGNOSIS — E7800 Pure hypercholesterolemia, unspecified: Secondary | ICD-10-CM | POA: Diagnosis not present

## 2019-01-18 DIAGNOSIS — Z1389 Encounter for screening for other disorder: Secondary | ICD-10-CM | POA: Diagnosis not present

## 2019-01-18 DIAGNOSIS — Z0000 Encounter for general adult medical examination without abnormal findings: Secondary | ICD-10-CM | POA: Diagnosis not present

## 2019-01-18 DIAGNOSIS — Z6841 Body Mass Index (BMI) 40.0 and over, adult: Secondary | ICD-10-CM | POA: Diagnosis not present

## 2019-01-18 DIAGNOSIS — Z1159 Encounter for screening for other viral diseases: Secondary | ICD-10-CM | POA: Diagnosis not present

## 2019-01-18 DIAGNOSIS — E1169 Type 2 diabetes mellitus with other specified complication: Secondary | ICD-10-CM | POA: Diagnosis not present

## 2019-01-18 DIAGNOSIS — E669 Obesity, unspecified: Secondary | ICD-10-CM | POA: Diagnosis not present

## 2019-01-18 DIAGNOSIS — I10 Essential (primary) hypertension: Secondary | ICD-10-CM | POA: Diagnosis not present

## 2019-01-26 ENCOUNTER — Other Ambulatory Visit: Payer: Self-pay | Admitting: Family Medicine

## 2019-01-26 DIAGNOSIS — M8589 Other specified disorders of bone density and structure, multiple sites: Secondary | ICD-10-CM

## 2019-03-14 ENCOUNTER — Other Ambulatory Visit: Payer: Self-pay

## 2019-03-14 NOTE — Patient Outreach (Signed)
[facility]) Care Management  03/14/2019   Karla Erickson [birth year] [id number]  Subjective: Successful outreach to the patient for assessment.  HIPAA verified.  The patient states that she is doing well.  She continues to have pain in her lower back.  She states that the pain is at a 4/10.  She states that she takes tylenol and it helps the pain.  The patient denies any falls.  The patient states that her FBS 140.  She states that she has started to change her diet and make healthier food choices.  She is getting out of the house and walking.  She uses the trail at her home.  The patient states that she is scheduled to have an appointment on the 20th of this month but is unsure if she will still have her appointment for f/u on her diabetes.   The patient states that if she has to go out of the home that she wears a mask and uses hand sanitizer.  Discussed signs and symptoms of covid 19 and talked about the precautionary measures.    Current Medications:  Current Outpatient Medications  Medication Sig Dispense Refill  . amLODipine (NORVASC) 5 MG tablet 1 tablet once daily    . aspirin EC 81 MG tablet Take 81 mg by mouth daily.    Marland Kitchen atorvastatin (LIPITOR) 10 MG tablet 1 tablet daily    . esomeprazole (NEXIUM) 40 MG capsule Take 40 mg by mouth daily at 12 noon.    . hydrochlorothiazide (HYDRODIURIL) 25 MG tablet Take 25 mg by mouth as needed.    Marland Kitchen levothyroxine (SYNTHROID, LEVOTHROID) 112 MCG tablet 1 tablet daily    . metFORMIN (GLUCOPHAGE) 500 MG tablet Take 500 mg by mouth daily.     No current facility-administered medications for this visit.     Functional Status:  In your present state of health, do you have any difficulty performing the following activities: 08/23/2018  Hearing? N  Vision? N  Difficulty concentrating or making decisions? N  Walking or climbing stairs? Y  Dressing or bathing? N  Doing errands, shopping? N  Some recent data might be hidden     Fall/Depression Screening: Fall Risk  03/14/2019 01/12/2019 11/24/2018  Falls in the past year? 0 0 0  Number falls in past yr: - - -  Injury with Fall? - - -  Follow up - - -   PHQ 2/9 Scores 08/23/2018 07/27/2018  PHQ - 2 Score 1 0    Assessment: Patient will continue to benefit from health coach outreach for disease management and support. THN CM Care Plan Problem One     Most Recent Value  THN Long Term Goal   In 90 days to patient will verbalize lowering her a1c of 8.9 by 1-2 points  [facility] Long Term Goal Start Date  03/14/19  Interventions for Problem One Long Term Goal  Reviewed FBS, discussed healthy food choices, exercise, and medication adherence.  Discussed signs and symptoms of covid 19 and precautionary measures     Plan: RN Health Coach will contact patient in the month of July and patient agrees to next outreach.   [name] RN, BSN, [facility] RN Health Coach Disease Management [facility] Dial:  [phone]  Fax: [phone]

## 2019-03-26 DIAGNOSIS — E039 Hypothyroidism, unspecified: Secondary | ICD-10-CM | POA: Diagnosis not present

## 2019-04-05 ENCOUNTER — Other Ambulatory Visit: Payer: Self-pay

## 2019-06-04 ENCOUNTER — Ambulatory Visit
Admission: RE | Admit: 2019-06-04 | Discharge: 2019-06-04 | Disposition: A | Discharge status: Home / Self Care | Payer: Medicare HMO | Source: Ambulatory Visit | Attending: Family Medicine | Admitting: Family Medicine

## 2019-06-04 ENCOUNTER — Other Ambulatory Visit: Payer: Self-pay

## 2019-06-04 DIAGNOSIS — M8589 Other specified disorders of bone density and structure, multiple sites: Secondary | ICD-10-CM | POA: Diagnosis not present

## 2019-06-04 DIAGNOSIS — Z780 Asymptomatic menopausal state: Secondary | ICD-10-CM | POA: Diagnosis not present

## 2019-06-13 ENCOUNTER — Other Ambulatory Visit: Payer: Self-pay

## 2019-06-13 NOTE — Patient Outreach (Signed)
[place] [facility]) Care Management  06/13/2019   GITEL BESTE 1956-06-21 [id number]  Subjective: Return call from the patient.  HIPAA verified.  The patient states that she is doing fine.  She states she has a few aches and pains with her back but no falls.  The patient states her FBS this morning was 170.  She states that it is better than it has been.  She states that she is trying to work on her diet. She is continuing to play around with it to see what foods work for her and what does not.  She states that her blood sugar have been up and down.  One thing that we pinpointed is that she is drinking orange juice when her blood sugars are high. She said she thought that was the thing do. I explained to her that she only needed to drink orange juice if she has a low blood sugar.  She verbalized understanding.   She states that she is not exercising as much as she would like.  In the everyday course of her chores she gets walking in.  She states she has an appointment to follow up with her diabetes on the 22nd of this month.   Current Medications:  Current Outpatient Medications  Medication Sig Dispense Refill  . amLODipine (NORVASC) 5 MG tablet 1 tablet once daily    . aspirin EC 81 MG tablet Take 81 mg by mouth daily.    Marland Kitchen atorvastatin (LIPITOR) 10 MG tablet 1 tablet daily    . esomeprazole (NEXIUM) 40 MG capsule Take 40 mg by mouth daily at 12 noon.    . hydrochlorothiazide (HYDRODIURIL) 25 MG tablet Take 25 mg by mouth as needed.    Marland Kitchen levothyroxine (SYNTHROID, LEVOTHROID) 112 MCG tablet 1 tablet daily    . metFORMIN (GLUCOPHAGE) 500 MG tablet Take 500 mg by mouth daily.     No current facility-administered medications for this visit.     Functional Status:  In your present state of health, do you have any difficulty performing the following activities: 08/23/2018  Hearing? N  Vision? N  Difficulty concentrating or making decisions? N  Walking or climbing stairs? Y  Dressing  or bathing? N  Doing errands, shopping? N  Some recent data might be hidden    Fall/Depression Screening: Fall Risk  06/13/2019 03/14/2019 01/12/2019  Falls in the past year? 0 0 0  Number falls in past yr: - - -  Injury with Fall? - - -  Follow up - - -   PHQ 2/9 Scores 08/23/2018 07/27/2018  PHQ - 2 Score 1 0    Assessment: Patient will continue to benefit from health coach outreach for disease management and support. THN CM Care Plan Problem One     Most Recent Value  THN Long Term Goal   In 60 days to patient will verbalize lowering her a1c of 8.9 by 1-2 points  [facility] Long Term Goal Start Date  06/13/19  Interventions for Problem One Long Term Goal  Discussed food and fluid choices, exercise and medications adherence     Plan: RN Health Coach will contact patient in the month of September and patient agrees to next outreach. Will mail the patient a copy of "ALL about the carbs".   [name] RN, BSN, [place] Direct Dial:  [phone]  Fax: [phone]

## 2019-06-13 NOTE — Patient Outreach (Signed)
[facility]la Ericks[facility]) Care Management  06/13/2019  Karla Erickson 1956-10-28 [id number]    1st unsuccessful outreach to the patient for assessment.  No answer.  HIPAA compliant voicemail left with contact information.  Plan: RN Health Coach will send letter. [place] will make outreach attempt to the patient within thirty business days.  [name] RN, BSN, [place] Direct Dial:  [phone]  Fax: [phone]

## 2019-06-27 DIAGNOSIS — E039 Hypothyroidism, unspecified: Secondary | ICD-10-CM | POA: Diagnosis not present

## 2019-07-10 ENCOUNTER — Ambulatory Visit: Payer: Self-pay

## 2019-07-19 DIAGNOSIS — E7800 Pure hypercholesterolemia, unspecified: Secondary | ICD-10-CM | POA: Diagnosis not present

## 2019-07-19 DIAGNOSIS — I10 Essential (primary) hypertension: Secondary | ICD-10-CM | POA: Diagnosis not present

## 2019-07-19 DIAGNOSIS — E1169 Type 2 diabetes mellitus with other specified complication: Secondary | ICD-10-CM | POA: Diagnosis not present

## 2019-07-19 DIAGNOSIS — E039 Hypothyroidism, unspecified: Secondary | ICD-10-CM | POA: Diagnosis not present

## 2019-07-20 DIAGNOSIS — E1169 Type 2 diabetes mellitus with other specified complication: Secondary | ICD-10-CM | POA: Diagnosis not present

## 2019-08-02 ENCOUNTER — Other Ambulatory Visit: Payer: Self-pay | Admitting: Family Medicine

## 2019-08-02 DIAGNOSIS — Z1231 Encounter for screening mammogram for malignant neoplasm of breast: Secondary | ICD-10-CM

## 2019-08-13 ENCOUNTER — Ambulatory Visit: Payer: Self-pay

## 2019-08-14 ENCOUNTER — Other Ambulatory Visit: Payer: Self-pay

## 2019-08-14 NOTE — Patient Outreach (Signed)
[facility]la Erickson) Care Management  08/14/2019  Karla Erickson 1956-02-12 [id number]   1st unsuccessful outreach to the patient for assessment.  No answer.  HIPAA compliant voicemail left with contact information.  Plan: RN Health Coach will send letter. [place] will make outreach attempt to the patient within thirty business days.   Karla Arms RN, BSN, [place] Direct Dial:  [phone]  Fax: [phone]

## 2019-09-07 ENCOUNTER — Other Ambulatory Visit: Payer: Self-pay

## 2019-09-07 NOTE — Patient Outreach (Signed)
[place] [facility]) Care Management  09/07/2019  Karla Erickson [birth year] [id number]    2nd attempt to outreach the patient for assessment.  No answer HIPAA compliant voicemail left with contact information.  Plan:  RN Health Coach will send information to Telephonic nurse that will handle the case to schedule for follow up.  [name] RN, BSN, [place] Direct Dial:  [phone]  Fax: [phone]

## 2019-09-12 NOTE — Telephone Encounter (Signed)
This encounter was created in error - please disregard.

## 2019-09-17 ENCOUNTER — Other Ambulatory Visit: Payer: Self-pay

## 2019-09-17 ENCOUNTER — Ambulatory Visit
Admission: RE | Admit: 2019-09-17 | Discharge: 2019-09-17 | Disposition: A | Discharge status: Home / Self Care | Payer: Medicare HMO | Source: Ambulatory Visit | Attending: Family Medicine | Admitting: Family Medicine

## 2019-09-17 DIAGNOSIS — Z1231 Encounter for screening mammogram for malignant neoplasm of breast: Secondary | ICD-10-CM

## 2019-09-18 ENCOUNTER — Other Ambulatory Visit: Payer: Self-pay

## 2019-09-18 NOTE — Patient Outreach (Signed)
[facility]) Care Management  [facility] Care Manager  09/18/2019   ROGUE PAUTLER August 11, 1956 [id number]  Telephone Assessment Annual Initial Assessment Completed   Outreach attempt #1 to patient. Spoke with patient who reports she is doing well. She voices that she got her flu shot about 2-3 weeks ago. After that she developed some GI symptoms of diarrhea and n&v for several days and lost weight. She reports she was told she did not have COVID-19 and It was possible SEs from getting higher dose of flu vaccine. Patient states that symptoms have since resolved and she is feeling better. She reports that cbgs have been fairly normal. Blood sugar this morning was 130. Patient states that when she saw PCP last she was started on Jardiance to help better manage Diabetes. Last A1C was 8.0(Aug 2020). Patient reports that she goes to see PCP next month and will likely have A1C tested then. She voices that she had her mammogram done on yesterday. She is aware that she needs to scheduled diabetic eye exam and plans to do so before the end of the year. She reports that she also plans to find a podiatrist to start seeing.   Encounter Medications:  Outpatient Encounter Medications as of 09/18/2019  Medication Sig Note  . amLODipine (NORVASC) 5 MG tablet 1 tablet once daily   . aspirin EC 81 MG tablet Take 81 mg by mouth daily.   Marland Kitchen atorvastatin (LIPITOR) 10 MG tablet 1 tablet daily   . empagliflozin (JARDIANCE) 25 MG TABS tablet Take 25 mg by mouth daily.   Marland Kitchen esomeprazole (NEXIUM) 40 MG capsule Take 40 mg by mouth daily at 12 noon.   . hydrochlorothiazide (HYDRODIURIL) 25 MG tablet Take 25 mg by mouth as needed.   Marland Kitchen levothyroxine (SYNTHROID, LEVOTHROID) 112 MCG tablet 1 tablet daily 03/14/2019: Patient  states she is taking 125 mcg.  . metFORMIN (GLUCOPHAGE) 500 MG tablet Take 500 mg by mouth daily. 08/23/2018: Patient states she takes four pills after dinner   No facility-administered encounter  medications on file as of 09/18/2019.     Functional Status:  In your present state of health, do you have any difficulty performing the following activities: 09/18/2019  Hearing? N  Vision? N  Difficulty concentrating or making decisions? Y  Walking or climbing stairs? N  Dressing or bathing? N  Doing errands, shopping? N  Preparing Food and eating ? N  Using the Toilet? N  In the past six months, have you accidently leaked urine? N  Do you have problems with loss of bowel control? N  Managing your Medications? N  Managing your Finances? N  Housekeeping or managing your Housekeeping? N  Some recent data might be hidden    Fall/Depression Screening: Fall Risk  09/18/2019 06/13/2019 03/14/2019  Falls in the past year? 0 0 0  Number falls in past yr: 0 - -  Injury with Fall? 0 - -  Risk for fall due to : History of fall(s);Impaired balance/gait;Impaired mobility;Medication side effect - -  Follow up Falls evaluation completed;Education provided - -   PHQ 2/9 Scores 09/18/2019 08/23/2018 07/27/2018  PHQ - 2 Score 0 1 0    Assessment:  THN CM Care Plan Problem One     Most Recent Value  Care Plan Problem One  Knowledge deficit related to disease process and mgmt of Diabetes.  Role Documenting the Problem One  Care Management Telephonic Coordinator  Care Plan for Problem One  Active  [facility]  Long Term Goal   Patient will report lowerin/decrease of A1C level from 8.0 in the next 90 days.  THN Long Term Goal Start Date  09/18/19  Interventions for Problem One Long Term Goal  RNCM reviewed cbg monitoring log with pt. RNCM discussed A1C parameters. RN CM educated pt. on diabetic diet..  THN CM Short Term Goal #1   Patient will report adherence to diabetic diet at least 90% of the time over the next 30 days.  THN CM Short Term Goal #1 Start Date  09/18/19  Interventions for Short Term Goal #1  RN CM educated pt. on diabeitc diet and restrictions.   THN CM Short Term Goal #2   Patient will  report adherence to diabetic med regimen 100% of the time over the next 30 days.  THN CM Short Term Goal #2 Start Date  09/18/19  Interventions for Short Term Goal #2  RN CM reviewed med list with patient. RN CM discussed new diabetic med-Jardiance with pt.      Plan:  RN CM provided patient with contact info and advised to call for any needs or concerns. RN CM will make outreach attempt to patient within a month. RN CM will send quarterly update to PCP.  Enzo Montgomery, RN,BSN,CCM [place] Management Telephonic Care Management Coordinator Direct Phone: [phone] Toll Free: [phone] Fax: [phone]

## 2019-09-19 ENCOUNTER — Ambulatory Visit: Payer: Medicare HMO

## 2019-10-11 DIAGNOSIS — R109 Unspecified abdominal pain: Secondary | ICD-10-CM | POA: Diagnosis not present

## 2019-10-11 DIAGNOSIS — R319 Hematuria, unspecified: Secondary | ICD-10-CM | POA: Diagnosis not present

## 2019-10-11 DIAGNOSIS — E1169 Type 2 diabetes mellitus with other specified complication: Secondary | ICD-10-CM | POA: Diagnosis not present

## 2019-10-11 DIAGNOSIS — N762 Acute vulvitis: Secondary | ICD-10-CM | POA: Diagnosis not present

## 2019-10-15 ENCOUNTER — Other Ambulatory Visit: Payer: Self-pay

## 2019-10-15 NOTE — Patient Outreach (Addendum)
[place] [facility]) Care Management  10/15/2019  KAITLYNNE WENZ 1956/10/07 [id number]   Telephone Assessment   Outreach attempt #1 to patient. Spoke with patient who voices she is doing fairly well. She shares that she had to go to the MD a few days ago as she developed "a rash down there." Patient reports that MD was unsure of what it was and the cause. She took some lab work and urine samples. Patient shares that she was started on a cream to apply to area. She has been using cream but feels like it is too early to tell if medicine is working. RN CM discussed with pt. possible causes and nonpharmacologic tx measures. Patient voiced understanding. She will follow up with MD if sxs worsen and/or are unresolved. She states office is to call her once lab results are available. Patient was pleased to report that  her A1C level was down to 7.2(10/11/2019). RN CM commended and praised patient on lowering A1C. MD has given her a goal of 7.0. RN CM discussed with patient importance of adhering to diet restrictions during the holidays.    THN CM Care Plan Problem One     Most Recent Value  Care Plan Problem One  Knowledge deficit related to disease process and mgmt of Diabetes.  Role Documenting the Problem One  Care Management Telephonic [place] for Problem One  Active  [facility] Long Term Goal   Patient will report lowerin/decrease of A1C level from 8.0 in the next 90 days.  THN Long Term Goal Start Date  09/18/19  THN Long Term Goal Met Date  10/15/19  THN CM Short Term Goal #1   Patient will report adherence to diabetic diet at least 90% of the time over the next 30 days.  THN CM Short Term Goal #1 Start Date  09/18/19  Interventions for Short Term Goal #1  RNCM assessed for diet adherence. RN CM discussed holiday eating and mgmt of blood sugars.  THN CM Short Term Goal #2   Patient will report adherence to diabetic med regimen 100% of the time over the next 30 days.  THN CM  Short Term Goal #2 Start Date  09/18/19  [facility] CM Short Term Goal #2 Met Date  10/15/19    [facility] CM Care Plan Problem Two     Most Recent Value  Care Plan Problem Two  Patient with unknown cause skin rash.  Role Documenting the Problem Two  Care Management Telephonic Coordinator  Care Plan for Problem Two  Active  THN CM Short Term Goal #1   patient will verbalize reoslution of skin rash over the next 30 days.  THN CM Short Term Goal #1 Start Date  10/15/19  Interventions for Short Term Goal #2   RNCM assessed and discussed skin rash wiht pt. RNCM educated pt on non-pharmcological tx measures. RNCM reviewed with pt. to seek medical attention for worsening and/or unresolved sxs.        Plan: RN CM discussed with patient next outreach within a month. Patient gave verbal consent and in agreement with RN CM follow up timeframe. Patient aware that they may contact RN CM sooner for any issues or concerns.   Enzo Montgomery, RN,BSN,CCM [place] Management Telephonic Care Management Coordinator Direct Phone: [phone] Toll Free: [phone] Fax: [phone]

## 2019-10-16 ENCOUNTER — Ambulatory Visit: Payer: Medicare HMO

## 2019-11-12 ENCOUNTER — Other Ambulatory Visit: Payer: Self-pay

## 2019-11-12 NOTE — Patient Outreach (Signed)
[facility]) Care Management  11/12/2019  Karla Erickson August 23, 1956 [id number]   Telephone Assessment   Outreach attempt #1 to patient. Spoke with patient who denies any acute issues or concerns at present. She reports that she has been doing fairly ell. She voices she has been having some intermittent arthritic pain to her hands and fingers related to change in the weather.She is taking Tylenol and Aspercream occassionally as needed with relief. Patient reports appetite remains fairly good. She reports weigh at 217 lbs and cbg this morning was 97-fasting. Patient voices that she continues to deal with yeast infection caused by Jardiance off and on. She stets that is getting better and she is able to verbalize measures to take to help treat sxs. She voices that UTI has cleared up. She denies any RN CM needs or concerns at present.    THN CM Care Plan Problem One     Most Recent Value  Care Plan Problem One  Knowledge deficit related to disease process and mgmt of Diabetes.  Role Documenting the Problem One  Care Management Telephonic [place] for Problem One  Active  [facility] Long Term Goal   Patient will report lowering/decrease in A1C of 7.2 over the next 90 days.  THN Long Term Goal Start Date  11/12/19  Interventions for Problem One Long Term Goal  RNCM assessed for med and diet adherence. RN CM reviewed cbg monitoring values log with pt.  THN CM Short Term Goal #1   Patient will report adherence to diabetic diet at least 90% of the time over the next 30 days.  THN CM Short Term Goal #1 Start Date  09/18/19  THN CM Short Term Goal #1 Met Date  11/12/19  THN CM Short Term Goal #2   Patient will report adherence to diabetic med regimen 100% of the time over the next 30 days.  THN CM Short Term Goal #2 Start Date  09/18/19  [facility] CM Short Term Goal #2 Met Date  10/15/19    [facility] CM Care Plan Problem Two     Most Recent Value  Care Plan Problem Two  Patient with  unknown cause skin rash.  Role Documenting the Problem Two  Care Management Telephonic Coordinator  Care Plan for Problem Two  Active  THN CM Short Term Goal #1   patient will verbalize reoslution of skin rash over the next 30 days.  THN CM Short Term Goal #1 Start Date  10/15/19  Interventions for Short Term Goal #2   RN CM assessed for sxs. RN CM reviewed with pt. ways to mange/treat sxs.      Plan: RN CM discussed with patient next outreach within the month of January. Patient gave verbal consent and in agreement with RN CM follow up and  timeframe. Patient aware that they may contact RN CM sooner for any issues or concerns.   Enzo Montgomery, RN,BSN,CCM [place] Management Telephonic Care Management Coordinator Direct Phone: [phone] Toll Free: [phone] Fax: [phone]

## 2019-11-13 ENCOUNTER — Ambulatory Visit: Payer: Self-pay

## 2019-12-14 ENCOUNTER — Other Ambulatory Visit: Payer: Self-pay

## 2019-12-14 NOTE — Patient Outreach (Addendum)
[facility]) Care Management  12/14/2019  Karla Erickson 1956/04/17 [id number]   Telephone Assessment   Outreach attempt #1 to patient. Spoke with patient who denies any acute issues or concerns at present. She stets that she has ben doing well. Blood sugars remain WNL for patient and ranging from the 90s-120s. Appetite remains good and patient adhering to diabetic diet. She voices that issue with yeast infection from [place] has improved. Patient is due to see MD and will follow up on when next appt. No recent med changes. She denies any RN CM needs or concerns at present.   THN CM Care Plan Problem One     Most Recent Value  Care Plan Problem One  Knowledge deficit related to disease process and mgmt of Diabetes.  Role Documenting the Problem One  Care Management Telephonic [place] for Problem One  Active  [facility] Long Term Goal   Patient will report lowering/decrease in A1C of 7.2 over the next 90 days.  THN Long Term Goal Start Date  11/12/19  Interventions for Problem One Long Term Goal  RNCM reviewed cbg montioring log and values with pt. RN CM assessed for any abnormal readings. RN CM confirmed pt adhering to DM diet.     THN CM Care Plan Problem Two     Most Recent Value  Care Plan Problem Two  Patient with unknown cause skin rash.  Role Documenting the Problem Two  Care Management Telephonic Coordinator  Care Plan for Problem Two  Active  THN CM Short Term Goal #1   patient will verbalize reoslution of skin rash over the next 30 days.  THN CM Short Term Goal #1 Start Date  10/15/19  [facility] CM Short Term Goal #1 Met Date   12/14/19      Plan: RN CM discussed with patient next outreach within a month. Patient gave verbal consent and in agreement with RN CM follow up and timeframe. Patient aware that they may contact RN CM sooner for any issues or concerns. RN CM will send quarterly update to PCP.    Enzo Montgomery, RN,BSN,CCM [place]  Management Telephonic Care Management Coordinator Direct Phone: [phone] Toll Free: [phone] Fax: [phone]

## 2020-01-14 ENCOUNTER — Other Ambulatory Visit: Payer: Self-pay

## 2020-01-14 NOTE — Patient Outreach (Signed)
[facility]) Care Management  01/14/2020  Karla Erickson 05/24/1956 [id number]   Telephone Assessment    Outreach attempt #1 to patient. No answer at present. RN CM left HIPAA compliant voicemail message along with contact info.       Plan: RN CM will make outreach attempt to patient within the month of March if no return call from patient.   Antionette Fairy, RN,BSN,CCM [facility] Care Management Telephonic Care Management Coordinator Direct Phone: [phone] Toll Free: [phone] Fax: [phone]

## 2020-02-06 ENCOUNTER — Other Ambulatory Visit: Payer: Self-pay

## 2020-02-06 NOTE — Patient Outreach (Signed)
[facility]) Care Management  02/06/2020  Karla Erickson February 23, 1956 [id number]   Telephone Assessment    Outreach attempt to patient. Spoke with patient who reported she was doing well. She is just returning home from grocery shopping. Patient continues to voice that she is adhering to safety guidelines for COVID-19. She reports that when her age group becomes available for vaccine she plans to get it. Her spouse has already received both spouse and had no issues. She reports that she has PCP appt next week. She is unsure if she will have labwork. Patient has not checked cbg today as she was out running errands all day. She reports cbgs have bene running WNL for her. She reports weather has helped improved her arthritis and no issues lately with that. She denies any RN CM needs or concerns at this time.     Plan: RN CM discussed with patient next outreach within the month of  May. Patient gave verbal consent and in agreement with RN CM follow up and timeframe. Patient aware that they may contact RN CM sooner for any issues or concerns.    Antionette Fairy, RN,BSN,CCM [facility] Care Management Telephonic Care Management Coordinator Direct Phone: [phone] Toll Free: [phone] Fax: [phone]

## 2020-02-13 ENCOUNTER — Ambulatory Visit: Payer: Self-pay

## 2020-02-13 DIAGNOSIS — M1990 Unspecified osteoarthritis, unspecified site: Secondary | ICD-10-CM | POA: Diagnosis not present

## 2020-02-13 DIAGNOSIS — Z0000 Encounter for general adult medical examination without abnormal findings: Secondary | ICD-10-CM | POA: Diagnosis not present

## 2020-02-13 DIAGNOSIS — K219 Gastro-esophageal reflux disease without esophagitis: Secondary | ICD-10-CM | POA: Diagnosis not present

## 2020-02-13 DIAGNOSIS — E7800 Pure hypercholesterolemia, unspecified: Secondary | ICD-10-CM | POA: Diagnosis not present

## 2020-02-13 DIAGNOSIS — Z7189 Other specified counseling: Secondary | ICD-10-CM | POA: Diagnosis not present

## 2020-02-13 DIAGNOSIS — R911 Solitary pulmonary nodule: Secondary | ICD-10-CM | POA: Diagnosis not present

## 2020-02-13 DIAGNOSIS — M797 Fibromyalgia: Secondary | ICD-10-CM | POA: Diagnosis not present

## 2020-02-13 DIAGNOSIS — E1169 Type 2 diabetes mellitus with other specified complication: Secondary | ICD-10-CM | POA: Diagnosis not present

## 2020-02-13 DIAGNOSIS — E039 Hypothyroidism, unspecified: Secondary | ICD-10-CM | POA: Diagnosis not present

## 2020-02-13 DIAGNOSIS — I10 Essential (primary) hypertension: Secondary | ICD-10-CM | POA: Diagnosis not present

## 2020-02-13 DIAGNOSIS — Z1389 Encounter for screening for other disorder: Secondary | ICD-10-CM | POA: Diagnosis not present

## 2020-02-15 ENCOUNTER — Other Ambulatory Visit: Payer: Self-pay | Admitting: Family Medicine

## 2020-02-15 ENCOUNTER — Ambulatory Visit
Admission: RE | Admit: 2020-02-15 | Discharge: 2020-02-15 | Disposition: A | Discharge status: Home / Self Care | Payer: Medicare HMO | Source: Ambulatory Visit | Attending: Family Medicine | Admitting: Family Medicine

## 2020-02-15 ENCOUNTER — Other Ambulatory Visit: Payer: Self-pay

## 2020-02-15 DIAGNOSIS — J8410 Pulmonary fibrosis, unspecified: Secondary | ICD-10-CM

## 2020-02-15 DIAGNOSIS — R911 Solitary pulmonary nodule: Secondary | ICD-10-CM

## 2020-03-11 ENCOUNTER — Other Ambulatory Visit: Payer: Self-pay

## 2020-03-11 NOTE — Patient Outreach (Signed)
[facility]) Care Management  03/11/2020  LAIA WILEY July 27, 1956 [id number]   Telephone Assessment   Outreach attempt to patient. No answer at present.   Plan: RN CM will make outreach attempt to patient within the month of May if no return call from patient.   Antionette Fairy, RN,BSN,CCM [facility] Care Management Telephonic Care Management Coordinator Direct Phone: [phone] Toll Free: [phone] Fax: [phone]

## 2020-03-31 ENCOUNTER — Other Ambulatory Visit: Payer: Self-pay

## 2020-03-31 NOTE — Patient Outreach (Signed)
[facility]) Care Management  03/31/2020  ERIC MORGANTI 18-Nov-1956 [id number]   Telephone Assessment   Outreach attempt # 2 to patient. No answer at present.     Plan: RN CM will make outreach attempt to patient within the month of May.  Antionette Fairy, RN,BSN,CCM [facility] Care Management Telephonic Care Management Coordinator Direct Phone: [phone] Toll Free: [phone] Fax: [phone]

## 2020-04-01 ENCOUNTER — Ambulatory Visit: Payer: Self-pay

## 2020-04-02 DIAGNOSIS — E119 Type 2 diabetes mellitus without complications: Secondary | ICD-10-CM | POA: Diagnosis not present

## 2020-04-28 ENCOUNTER — Other Ambulatory Visit: Payer: Self-pay

## 2020-04-28 NOTE — Patient Outreach (Addendum)
[facility]) Care Management  04/28/2020  Karla Erickson [birth year] [id number]   Telephone Assessment    Outreach attempt to patient. Spoke with patient who reports she has been doing well. She denies any acute issues or concerns at present. She reports that she is due to see PCP within the next two months. Blood sugar have been controlled. She voices cbg this am was 153-fasting. Patient continues to monitor cbgs as ordered. She reports she has stopped taking Jardaince as she did not like the yeast infections that Iwas causing and MD aware. Most recent A1C on file 6.9(March 2021). Patient denies any falls. She remains independent with ADLs/IADLs. She lives with supportive spouse who is able to assist her as needed. Patient reports that she got both doses of her COVID-19 vaccine and tolerated them well. She denies any pain at present. She voices no RN CM needs or concerns at present.    Medications Reviewed Today    Reviewed by Hayden Pedro, RN (Registered Nurse) on 12/14/19 at 60  Med List Status: <None>  Medication Order Taking? Sig Documenting Provider Last Dose Status Informant  acetaminophen (TYLENOL) 325 MG tablet [id number]  Take 650 mg by mouth every 6 (six) hours as needed. [provider]  Active Self  amLODipine (NORVASC) 5 MG tablet [id number] No 1 tablet once daily [provider] Taking Active   aspirin EC 81 MG tablet [id number] No Take 81 mg by mouth daily. [provider] Taking Active Self  atorvastatin (LIPITOR) 10 MG tablet [id number] No 1 tablet daily [provider] Taking Active   betamethasone dipropionate 0.05 % cream [id number] No Apply topically daily. [provider] Taking Active Self  empagliflozin (JARDIANCE) 25 MG TABS tablet [id number] No Take 25 mg by mouth daily. [provider] Taking Active Self  esomeprazole (NEXIUM) 40 MG capsule [id number] No Take 40 mg by mouth daily at 12 noon.  [provider] Taking Active Self  hydrochlorothiazide (HYDRODIURIL) 25 MG tablet [id number] No Take 25 mg by mouth as needed. [provider] Taking Active Self  levothyroxine (SYNTHROID, LEVOTHROID) 112 MCG tablet [id number] No 1 tablet daily [provider] Taking Active            Med Note Cheryll Cockayne, Alfonse Spruce Mar 14, 2019 10:48 AM) Patient  states she is taking 125 mcg.  metFORMIN (GLUCOPHAGE) 500 MG tablet [id number] No Take 500 mg by mouth daily. [provider] Taking Active Self           Med Note Cheryll Cockayne, Alfonse Spruce Aug 23, 2018 12:47 PM) Patient states she takes four pills after dinner  Trolamine Salicylate (ASPERCREME EX) [id number]  Apply topically daily as needed. [provider]  Active Self         Fall Risk  04/28/2020 12/14/2019 09/18/2019 06/13/2019 03/14/2019  Falls in the past year? 0 0 0 0 0  Number falls in past yr: 0 0 0 - -  Injury with Fall? 0 0 0 - -  Risk for fall due to : Impaired vision;Medication side effect Medication side effect History of fall(s);Impaired balance/gait;Impaired mobility;Medication side effect - -  Follow up Falls evaluation completed;Education provided Falls evaluation completed Falls evaluation completed;Education provided - -   Depression screen [facility] 2/9 04/28/2020 09/18/2019 08/23/2018 07/27/2018  Decreased Interest 0 0 1 0  Down, Depressed, Hopeless 0 0 0 0  PHQ - 2 Score 0 0 1 0   SDOH Screenings  Alcohol Screen:   . Last Alcohol Screening Score (AUDIT):   Depression (PHQ2-9): Low Risk   . PHQ-2 Score: 0  Financial Resource Strain:   . Difficulty of Paying Living Expenses:   Food Insecurity: No Food Insecurity  . Worried About Charity fundraiser in the Last Year: Never true  . Ran Out of Food in the Last Year: Never true  Housing: Low Risk   . Last Housing Risk Score: 0  Physical Activity:   . Days of Exercise per Week:   . Minutes of Exercise per Session:   Social Connections:   .  Frequency of Communication with Friends and Family:   . Frequency of Social Gatherings with Friends and Family:   . Attends Religious Services:   . Active Member of Clubs or Organizations:   . Attends Archivist Meetings:   Marland Kitchen Marital Status:   Stress:   . Feeling of Stress :   Tobacco Use: Medium Risk  . Smoking Tobacco Use: Former Smoker  . Smokeless Tobacco Use: Never Used  Transportation Needs: No Transportation Needs  . Lack of Transportation (Medical): No  . Lack of Transportation (Non-Medical): No   THN CM Care Plan Problem One     Most Recent Value  Care Plan Problem One  Knowledge deficit related to disease process and mgmt of Diabetes.  Role Documenting the Problem One  Care Management Telephonic [place] for Problem One  Active  [facility] Long Term Goal   Patient will report lowering/decrease in A1C of 7.2 over the next 90 days.  THN Long Term Goal Start Date  11/12/19  Interventions for Problem One Long Term Goal  RN CM reviewed cbg montiroing log and values. RN CM dsicussed last A1C level and need for updated testing. RN CM reinforced adherence to diabetic diet.     [facility] CM Care Plan Problem Two     Most Recent Value  Care Plan Problem Two  Health maintenance-patient needs COVID-19 vaccine  Role Documenting the Problem Two  Care Management Telephonic Coordinator  Care Plan for Problem Two  Active  THN Long Term Goal  Patient will report receiving COVID-19 vaccine within the next 90 days.  THN Long Term Goal Start Date  02/06/20  [facility] Long Term Goal Met Date  04/28/20     Plan: RN CM discussed with patient next outreach within the month of July. Patient gave verbal consent and in agreement with RN CM follow up and timeframe. Patient aware that they may contact RN CM sooner for any issues or concerns. RN CM will send quarterly update to PCP.  Enzo Montgomery, RN,BSN,CCM [place] Management Telephonic Care Management Coordinator Direct Phone:  [phone] Toll Free: [phone] Fax: [phone]

## 2020-05-01 ENCOUNTER — Ambulatory Visit: Payer: Self-pay

## 2020-05-23 ENCOUNTER — Other Ambulatory Visit: Payer: Self-pay

## 2020-05-23 ENCOUNTER — Ambulatory Visit: Payer: Medicare HMO | Admitting: Podiatry

## 2020-05-23 DIAGNOSIS — E119 Type 2 diabetes mellitus without complications: Secondary | ICD-10-CM

## 2020-05-27 ENCOUNTER — Encounter: Payer: Self-pay | Admitting: Podiatry

## 2020-05-27 NOTE — Progress Notes (Signed)
Subjective: Karla Erickson presents today referred by Kelton Pillar, MD for diabetic foot evaluation.  Patient relates greater than 3 year history of diabetes.  Patient denies any history of foot wounds.  Patient denies any history of numbness, tingling, burning, pins/needles sensations.  Past Medical History:  Diagnosis Date  . Hyperlipidemia   . Hypertension   . Hypothyroid   . Pulmonary nodule     Patient Active Problem List   Diagnosis Date Noted  . Chest pain 08/10/2012  . PULMONARY FIBROSIS ILD POST INFLAMMATORY CHRONIC 02/11/2011  . PULMONARY NODULE 12/10/2008  . DYSPNEA 12/10/2008  . HYPOTHYROIDISM 12/09/2008  . MORBID OBESITY 12/09/2008  . HYPERTENSION 12/09/2008  . ALLERGIC RHINITIS 12/09/2008  . CONNECTIVE TISSUE DISEASE 12/09/2008    Past Surgical History:  Procedure Laterality Date  . ABDOMINAL HYSTERECTOMY     partial  . BACK SURGERY    . LUNG BIOPSY    . THYROID SURGERY Bilateral     Current Outpatient Medications on File Prior to Visit  Medication Sig Dispense Refill  . acetaminophen (TYLENOL) 325 MG tablet Take 650 mg by mouth every 6 (six) hours as needed.    Marland Kitchen amLODipine (NORVASC) 5 MG tablet 1 tablet once daily    . aspirin EC 81 MG tablet Take 81 mg by mouth daily.    Marland Kitchen atorvastatin (LIPITOR) 10 MG tablet 1 tablet daily    . betamethasone dipropionate 0.05 % cream Apply topically daily.    . empagliflozin (JARDIANCE) 25 MG TABS tablet Take 25 mg by mouth daily. Pt states not taking due to causing yeast infections    . esomeprazole (NEXIUM) 40 MG capsule Take 40 mg by mouth daily at 12 noon.    . hydrochlorothiazide (HYDRODIURIL) 25 MG tablet Take 25 mg by mouth as needed.    Marland Kitchen levothyroxine (SYNTHROID, LEVOTHROID) 112 MCG tablet 1 tablet daily    . metFORMIN (GLUCOPHAGE) 500 MG tablet Take 500 mg by mouth daily.    Loura Pardon Salicylate (ASPERCREME EX) Apply topically daily as needed.     No current facility-administered medications on file  prior to visit.     No Known Allergies  Social History   Occupational History  . Occupation: Disabled  Tobacco Use  . Smoking status: Former Smoker    Packs/day: 0.30    Years: 10.00    Pack years: 3.00    Types: Cigarettes    Quit date: 12/06/1988    Years since quitting: 31.4  . Smokeless tobacco: Never Used  Substance and Sexual Activity  . Alcohol use: No  . Drug use: No  . Sexual activity: Not on file    Family History  Problem Relation Age of Onset  . Breast cancer Neg Hx     Immunization History  Administered Date(s) Administered  . PFIZER SARS-COV-2 Vaccination 02/21/2020, 03/13/2020    Review of systems: Positive Findings in bold print.  Constitutional:  chills, fatigue, fever, sweats, weight change Communication: Optometrist, sign Ecologist, hand writing, iPad/Android device Head: headaches, head injury Eyes: changes in vision, eye pain, glaucoma, cataracts, macular degeneration, diplopia, glare,  light sensitivity, eyeglasses or contacts, blindness Ears nose mouth throat: hearing impaired, hearing aids,  ringing in ears, deaf, sign language,  vertigo, nosebleeds,  rhinitis,  cold sores, snoring, swollen glands Cardiovascular: HTN, edema, arrhythmia, pacemaker in place, defibrillator in place, chest pain/tightness, chronic anticoagulation, blood clot, heart failure, MI Peripheral Vascular: leg cramps, varicose veins, blood clots, lymphedema, varicosities Respiratory:  asthma, difficulty breathing, denies  congestion, SOB, wheezing, cough, emphysema Gastrointestinal: change in appetite or weight, abdominal pain, constipation, diarrhea, nausea, vomiting, vomiting blood, change in bowel habits, abdominal pain, jaundice, rectal bleeding, hemorrhoids, GERD Genitourinary:  nocturia,  pain on urination, polyuria,  blood in urine, Foley catheter, urinary urgency, ESRD on hemodialysis Musculoskeletal: amputation, cramping, stiff joints, painful joints, decreased  joint motion, fractures, OA, gout, hemiplegia, paraplegia, uses cane, wheelchair bound, uses walker, uses rollator Skin: +changes in toenails, color change, dryness, itching, mole changes,  rash, wound(s) Neurological: headaches, numbness in feet, paresthesias in feet, burning in feet, fainting,  seizures, change in speech, migraines, memory problems/poor historian, cerebral palsy, weakness, paralysis, CVA, TIA Endocrine: diabetes, hypothyroidism, hyperthyroidism,  goiter, dry mouth, flushing, heat intolerance, cold intolerance,  excessive thirst, denies polyuria,  nocturia Hematological:  easy bleeding, excessive bleeding, easy bruising, enlarged lymph nodes, on long term blood thinner, history of past transusions Allergy/immunological:  hives, eczema, frequent infections, multiple drug allergies, seasonal allergies, transplant recipient, multiple food allergies Psychiatric:  anxiety, depression, mood disorder, suicidal ideations, hallucinations, insomnia  Objective: There were no vitals filed for this visit. Vascular Examination: Capillary refill time less than 3 x 10 digits.  Dorsalis pedis pulses palpable 2 out of 4.  Posterior tibial pulses palpable 2 out of 4.  Digital hair not present x 10 digits.  Skin temperature gradient WNL b/l.  Dermatological Examination: Skin with normal turgor, texture and tone b/l  Toenails 1-5 b/l discolored, thick, dystrophic with subungual debris and pain with palpation to nailbeds due to thickness of nails.  Musculoskeletal: Muscle strength 5/5 to all LE muscle groups.  Neurological: Sensation intact with 10 gram monofilament.  Vibratory sensation intact.  Assessment: 1. NIDDM 2. Encounter for diabetic foot examination  Plan: 1. Discussed diabetic foot care principles. Literature dispensed on today. 2. Patient to continue soft, supportive shoe gear daily. 3. Patient to report any pedal injuries to medical professional  immediately. 4. Follow up one year. 5. Patient/POA to call should there be a concern in the interim.

## 2020-06-23 ENCOUNTER — Other Ambulatory Visit: Payer: Self-pay

## 2020-06-23 NOTE — Patient Outreach (Signed)
[facility]) Care Management  06/23/2020  Karla Erickson May 20, 1956 [id number]   Telephone Assessment    Outreach attempt to patient. Spoke with spouse who reported that patient was not available at present.        Plan: RN CM will make outreach attempt to patient within the month of August if no return call.    Antionette Fairy, RN,BSN,CCM [facility] Care Management Telephonic Care Management Coordinator Direct Phone: [phone] Toll Free: [phone] Fax: [phone]

## 2020-06-24 ENCOUNTER — Ambulatory Visit: Payer: Self-pay

## 2020-07-22 ENCOUNTER — Other Ambulatory Visit: Payer: Self-pay

## 2020-07-22 NOTE — Patient Outreach (Signed)
[facility]) Care Management  07/22/2020  Karla Erickson Feb 10, 1956 [id number]   Telephone Assessment    Outreach attempt to patient. Spoke with patient who reports she is doing well. She states that her cbg was a little hight his morning at 221 before eating. She reports not eating anything unusual yesterday except for a few jelly beans earlier in the day. She drank some water, took meds and rechecked blood sugar and it was 159. Patient aware of s/s of hypo/hyperglycemia and how to treat. She reports she goes next month for A1C testing. She continues to voice that she is adhering to safety guidelines given rise in COVID-19 cases. She plans to talk with MD to see if she needs to get COVID-19 booster shot. She denies any RN CM needs or concerns at this time.    Medications Reviewed Today    Reviewed by Charlyn Minerva, RN (Registered Nurse) on 07/22/20 at 1048  Med List Status: <None>  Medication Order Taking? Sig Documenting Provider Last Dose Status Informant  acetaminophen (TYLENOL) 325 MG tablet [id number] No Take 650 mg by mouth every 6 (six) hours as needed. [provider] Taking Active Self  amLODipine (NORVASC) 5 MG tablet [id number] No 1 tablet once daily [provider] Taking Active   aspirin EC 81 MG tablet [id number] No Take 81 mg by mouth daily. [provider] Taking Active Self  atorvastatin (LIPITOR) 10 MG tablet [id number] No 1 tablet daily [provider] Taking Active   betamethasone dipropionate 0.05 % cream [id number] No Apply topically daily. [provider] Taking Active Self  empagliflozin (JARDIANCE) 25 MG TABS tablet [id number] No Take 25 mg by mouth daily. Pt states not taking due to causing yeast infections [provider] Taking Active Self  esomeprazole (NEXIUM) 40 MG capsule [id number] No Take 40 mg by mouth daily at 12 noon. [provider] Taking Active Self  hydrochlorothiazide  (HYDRODIURIL) 25 MG tablet [id number] No Take 25 mg by mouth as needed. [provider] Taking Active Self  levothyroxine (SYNTHROID, LEVOTHROID) 112 MCG tablet [id number] No 1 tablet daily [provider] Taking Active            Med Note Bing Ree, Deland Pretty Mar 14, 2019 10:48 AM) Patient  states she is taking 125 mcg.  metFORMIN (GLUCOPHAGE) 500 MG tablet [id number] No Take 500 mg by mouth daily. [provider] Taking Active Self           Med Note Bing Ree, Deland Pretty Aug 23, 2018 12:47 PM) Patient states she takes four pills after dinner  Trolamine Salicylate (ASPERCREME EX) [id number] No Apply topically daily as needed. [provider] Taking Active Self         [facility] CM Care Plan Problem One     Most Recent Value  Care Plan Problem One Knowledge deficit related to disease process and mgmt of Diabetes.  Role Documenting the Problem One Care Management Telephonic Coordinator  Care Plan for Problem One Active  [facility] Long Term Goal  Patient will report lowering/decrease in A1C of 7.2 over the next 90 days.  THN Long Term Goal Start Date 11/12/19  Interventions for Problem One Long Term Goal RNCM reviewd current cbg montiroing and log, RN CM confirmed pt has upcoming A1C appt testing      Plan: RN CM discussed with patient next outreach within the month of October . Patient gave verbal consent and in agreement with  RN CM follow up and timeframe. Patient aware that they may contact RN CM sooner for any issues or concerns. RN CM will send quarterly update to PCP.    Antionette Fairy, RN,BSN,CCM [facility] Care Management Telephonic Care Management Coordinator Direct Phone: [phone] Toll Free: [phone] Fax: [phone]

## 2020-07-30 ENCOUNTER — Ambulatory Visit: Payer: Self-pay

## 2020-08-15 ENCOUNTER — Other Ambulatory Visit: Payer: Self-pay | Admitting: Family Medicine

## 2020-08-15 DIAGNOSIS — Z1231 Encounter for screening mammogram for malignant neoplasm of breast: Secondary | ICD-10-CM

## 2020-08-15 DIAGNOSIS — I10 Essential (primary) hypertension: Secondary | ICD-10-CM | POA: Diagnosis not present

## 2020-08-15 DIAGNOSIS — E1169 Type 2 diabetes mellitus with other specified complication: Secondary | ICD-10-CM | POA: Diagnosis not present

## 2020-08-15 DIAGNOSIS — E669 Obesity, unspecified: Secondary | ICD-10-CM | POA: Diagnosis not present

## 2020-08-15 DIAGNOSIS — E039 Hypothyroidism, unspecified: Secondary | ICD-10-CM | POA: Diagnosis not present

## 2020-08-15 DIAGNOSIS — E7800 Pure hypercholesterolemia, unspecified: Secondary | ICD-10-CM | POA: Diagnosis not present

## 2020-09-05 ENCOUNTER — Ambulatory Visit: Payer: Medicare HMO | Admitting: Podiatry

## 2020-09-13 DIAGNOSIS — Z23 Encounter for immunization: Secondary | ICD-10-CM | POA: Diagnosis not present

## 2020-09-15 ENCOUNTER — Other Ambulatory Visit: Payer: Self-pay

## 2020-09-15 NOTE — Patient Outreach (Signed)
[facility]) Care Management  09/15/2020  MERLY HINKSON [birth year] [id number]   Telephone Assessment   Outreach attempt # 1 to patient. Spoke briefly with patient who reported she was eating breakfast and then getting ready to go grocery shopping. She states she has been feeling and doing well. She went over the weekend and got flu vaccine at clinic. She plans to get booster vaccine after her upcoming mammogram exam later this month. She saw PCP recently and had lab work done. Her A1C has gone up slightly from 6.9 to 7.4. Goal remains 7.0 or less. Blood sugar this morning was 140. Patient reports she is working on eating more fruits and veggies. She denies any falls. She remains independent with ADLs/IADLs and lives with supportive spouse. She denies any RN CM needs or concerns at this time.      Plan: RN CM discussed with patient next outreach within the month of  Dec . Patient gave verbal consent and in agreement with RN CM follow up and timeframe. Patient aware that they may contact RN CM sooner for any issues or concerns.  Antionette Fairy, RN,BSN,CCM [facility] Care Management Telephonic Care Management Coordinator Direct Phone: [phone] Toll Free: [phone] Fax: [phone]

## 2020-09-22 ENCOUNTER — Other Ambulatory Visit: Payer: Self-pay

## 2020-09-22 ENCOUNTER — Ambulatory Visit
Admission: RE | Admit: 2020-09-22 | Discharge: 2020-09-22 | Disposition: A | Discharge status: Home / Self Care | Payer: Medicare HMO | Source: Ambulatory Visit | Attending: Family Medicine | Admitting: Family Medicine

## 2020-09-22 DIAGNOSIS — Z1231 Encounter for screening mammogram for malignant neoplasm of breast: Secondary | ICD-10-CM | POA: Diagnosis not present

## 2020-09-23 ENCOUNTER — Ambulatory Visit: Payer: Self-pay

## 2020-11-18 DIAGNOSIS — E1169 Type 2 diabetes mellitus with other specified complication: Secondary | ICD-10-CM | POA: Diagnosis not present

## 2020-11-21 ENCOUNTER — Other Ambulatory Visit: Payer: Self-pay

## 2020-11-21 NOTE — Patient Outreach (Signed)
[facility]) Care Management  11/21/2020  Karla Erickson 17-Apr-1956 [id number]   Telephone Assessment  Outreach attempt to patient. Spoke with patient who reports she is doing well. She denies any acute issues or concerns a present. She remains independent with ADLs/IADLs. No recent falls. She went earlier this week for lab work-A1C testing and is awaiting results. Patient reports she is adhering to diabetic diet. Blood sugars have been in the mid 100's. Wgt stable. She states she got flu vaccine in Oct. She went and got COVID-19 booster shot last month and tolerated well. She voices that she will be travelling via plane to [place] to visit family for the holidays. RN CM reviewed safety precautions measures with patient. She verbalized understanding. She denies any RN CM needs or concerns at present.    Medications Reviewed Today    Reviewed by Charlyn Minerva, RN (Registered Nurse) on 11/21/20 at [phone]  Med List Status: <None>  Medication Order Taking? Sig Documenting Provider Last Dose Status Informant  acetaminophen (TYLENOL) 325 MG tablet [id number] No Take 650 mg by mouth every 6 (six) hours as needed. [provider] Taking Active Self  amLODipine (NORVASC) 5 MG tablet [id number] No 1 tablet once daily [provider] Taking Active   aspirin EC 81 MG tablet [id number] No Take 81 mg by mouth daily. [provider] Taking Active Self  atorvastatin (LIPITOR) 10 MG tablet [id number] No 1 tablet daily [provider] Taking Active   betamethasone dipropionate 0.05 % cream [id number] No Apply topically daily. [provider] Taking Active Self  empagliflozin (JARDIANCE) 25 MG TABS tablet [id number] No Take 25 mg by mouth daily. Pt states not taking due to causing yeast infections [provider] Taking Active Self  esomeprazole (NEXIUM) 40 MG capsule [id number] No Take 40 mg by mouth daily at 12 noon. [provider]  Taking Active Self  hydrochlorothiazide (HYDRODIURIL) 25 MG tablet [id number] No Take 25 mg by mouth as needed. [provider] Taking Active Self  levothyroxine (SYNTHROID, LEVOTHROID) 112 MCG tablet [id number] No 1 tablet daily [provider] Taking Active            Med Note Bing Ree, Deland Pretty Mar 14, 2019 10:48 AM) Patient  states she is taking 125 mcg.  metFORMIN (GLUCOPHAGE) 500 MG tablet [id number] No Take 500 mg by mouth daily. [provider] Taking Active Self           Med Note Bing Ree, Deland Pretty Aug 23, 2018 12:47 PM) Patient states she takes four pills after dinner  Trolamine Salicylate (ASPERCREME EX) [id number] No Apply topically daily as needed. [provider] Taking Active Self         Goals Addressed            This Visit's Progress   . Monitor and Manage My Blood Sugar       Timeframe:  Long-Range Goal Priority:  High Start Date:  09/15/2020                           Expected End Date: 03/04/2021                     Follow Up Date March 2022   - check blood sugar at prescribed times - check blood sugar if I feel it is too high or too low - take the blood sugar log to  all doctor visits - take the blood sugar meter to all doctor visits    Why is this important?   Checking your blood sugar at home helps to keep it from getting very high or very low.  Writing the results in a diary or log helps the doctor know how to care for you.  Your blood sugar log should have the time, date and the results.  Also, write down the amount of insulin or other medicine that you take.  Other information, like what you ate, exercise done and how you were feeling, will also be helpful.     Notes:  11/21/20-Patient reports she went this week for A1c testing-awaiting MD office to call with results    . Set My Target A1C       Timeframe:  Long-Range Goal Priority:  High Start Date:  09/15/2020                           Expected End Date:   03/05/2021                     Follow Up Date March 2022   - set target A1C(maintain A1C level of 7.0 or less)    Why is this important?   Your target A1C is decided together by you and your doctor.  It is based on several things like your age and other health issues.    Notes:        Plan: RN CM will send quarterly update to PCP.  RN CM discussed with patient next outreach within the month of March. Patient gave verbal consent and in agreement with RN CM follow up and timeframe. Patient aware that they may contact RN CM sooner for any issues or concerns. RN CM reviewed goals and plan of care with patient. Patient in agreement.   Antionette Fairy, RN,BSN,CCM [facility] Care Management Telephonic Care Management Coordinator Direct Phone: [phone] Toll Free: [phone] Fax: [phone]

## 2021-02-13 ENCOUNTER — Other Ambulatory Visit: Payer: Self-pay | Admitting: Family Medicine

## 2021-02-13 ENCOUNTER — Other Ambulatory Visit: Payer: Self-pay

## 2021-02-13 ENCOUNTER — Ambulatory Visit
Admission: RE | Admit: 2021-02-13 | Discharge: 2021-02-13 | Disposition: A | Discharge status: Home / Self Care | Payer: HMO | Source: Ambulatory Visit | Attending: Family Medicine | Admitting: Family Medicine

## 2021-02-13 DIAGNOSIS — R911 Solitary pulmonary nodule: Secondary | ICD-10-CM

## 2021-02-13 NOTE — Patient Outreach (Signed)
[facility]) Care Management  02/13/2021  Karla Erickson [birth year] [id number]   Telephone Assessment   Unsuccessful outreach attempt to patient. No answer at present. RN CM left HIPAA compliant voicemail message along with contact info.     Plan: RN CM will make outreach attempt patient within the month of May if no return call from patient.   Antionette Fairy, RN,BSN,CCM [facility] Care Management Telephonic Care Management Coordinator Direct Phone: [phone] Toll Free: [phone] Fax: [phone]

## 2021-02-17 ENCOUNTER — Other Ambulatory Visit: Payer: Self-pay | Admitting: Family Medicine

## 2021-02-17 DIAGNOSIS — R911 Solitary pulmonary nodule: Secondary | ICD-10-CM

## 2021-02-19 ENCOUNTER — Other Ambulatory Visit: Payer: Self-pay

## 2021-02-19 NOTE — Patient Outreach (Signed)
[facility]) Care Management  02/19/2021  Karla Erickson [birth year] [id number]   Case Closure    RN CM received notification that patient has switched to HTA coverage.      Plan: RN CM will close case at this time.   Antionette Fairy, RN,BSN,CCM [facility] Care Management Telephonic Care Management Coordinator Direct Phone: [phone] Toll Free: [phone] Fax: [phone]

## 2021-02-28 ENCOUNTER — Other Ambulatory Visit: Payer: Self-pay

## 2021-02-28 ENCOUNTER — Ambulatory Visit
Admission: RE | Admit: 2021-02-28 | Discharge: 2021-02-28 | Disposition: A | Discharge status: Home / Self Care | Payer: HMO | Source: Ambulatory Visit | Attending: Family Medicine | Admitting: Family Medicine

## 2021-02-28 DIAGNOSIS — R911 Solitary pulmonary nodule: Secondary | ICD-10-CM

## 2021-03-02 ENCOUNTER — Other Ambulatory Visit (HOSPITAL_COMMUNITY): Payer: Self-pay | Admitting: Family Medicine

## 2021-03-02 ENCOUNTER — Other Ambulatory Visit: Payer: Self-pay | Admitting: Family Medicine

## 2021-03-02 DIAGNOSIS — R918 Other nonspecific abnormal finding of lung field: Secondary | ICD-10-CM

## 2021-03-02 DIAGNOSIS — R9389 Abnormal findings on diagnostic imaging of other specified body structures: Secondary | ICD-10-CM

## 2021-03-02 DIAGNOSIS — R911 Solitary pulmonary nodule: Secondary | ICD-10-CM

## 2021-03-13 ENCOUNTER — Ambulatory Visit (HOSPITAL_COMMUNITY)
Admission: RE | Admit: 2021-03-13 | Discharge: 2021-03-13 | Disposition: A | Discharge status: Home / Self Care | Payer: HMO | Source: Ambulatory Visit | Attending: Family Medicine | Admitting: Family Medicine

## 2021-03-13 ENCOUNTER — Other Ambulatory Visit: Payer: Self-pay

## 2021-03-13 DIAGNOSIS — R9389 Abnormal findings on diagnostic imaging of other specified body structures: Secondary | ICD-10-CM | POA: Diagnosis present

## 2021-03-13 DIAGNOSIS — R911 Solitary pulmonary nodule: Secondary | ICD-10-CM | POA: Insufficient documentation

## 2021-03-13 DIAGNOSIS — I700 Atherosclerosis of aorta: Secondary | ICD-10-CM | POA: Diagnosis not present

## 2021-03-13 DIAGNOSIS — K760 Fatty (change of) liver, not elsewhere classified: Secondary | ICD-10-CM | POA: Diagnosis not present

## 2021-03-13 DIAGNOSIS — K5730 Diverticulosis of large intestine without perforation or abscess without bleeding: Secondary | ICD-10-CM | POA: Insufficient documentation

## 2021-03-13 DIAGNOSIS — K449 Diaphragmatic hernia without obstruction or gangrene: Secondary | ICD-10-CM | POA: Diagnosis not present

## 2021-03-13 DIAGNOSIS — R918 Other nonspecific abnormal finding of lung field: Secondary | ICD-10-CM | POA: Diagnosis present

## 2021-03-13 LAB — GLUCOSE, CAPILLARY: Glucose-Capillary: 143 mg/dL — ABNORMAL HIGH (ref 70–99)

## 2021-03-13 MED ORDER — FLUDEOXYGLUCOSE F - 18 (FDG) INJECTION
10.9000 | Freq: Once | INTRAVENOUS | Status: AC
  Administered 2021-03-13: 10.9 via INTRAVENOUS

## 2021-04-02 ENCOUNTER — Encounter: Payer: Self-pay | Admitting: Emergency Medicine

## 2021-04-02 ENCOUNTER — Telehealth: Payer: Self-pay | Admitting: Emergency Medicine

## 2021-04-02 ENCOUNTER — Ambulatory Visit: Payer: HMO | Admitting: Emergency Medicine

## 2021-04-02 ENCOUNTER — Other Ambulatory Visit: Payer: Self-pay

## 2021-04-02 VITALS — BP 122/80 | HR 99 | Temp 97.3°F | Ht 63.0 in | Wt 226.8 lb

## 2021-04-02 DIAGNOSIS — R918 Other nonspecific abnormal finding of lung field: Secondary | ICD-10-CM | POA: Diagnosis not present

## 2021-04-02 LAB — BASIC METABOLIC PANEL
BUN: 10 mg/dL (ref 6–23)
CO2: 28 mEq/L (ref 19–32)
Calcium: 10.1 mg/dL (ref 8.4–10.5)
Chloride: 103 mEq/L (ref 96–112)
Creatinine, Ser: 0.83 mg/dL (ref 0.40–1.20)
GFR: 73.78 mL/min (ref >60.00)
Glucose, Bld: 146 mg/dL — ABNORMAL HIGH (ref 70–99)
Potassium: 4.1 mEq/L (ref 3.5–5.1)
Sodium: 137 mEq/L (ref 135–145)

## 2021-04-02 LAB — CBC WITH DIFFERENTIAL/PLATELET
Basophils Absolute: 0 10*3/uL (ref 0.0–0.1)
Basophils Relative: 0.6 % (ref 0.0–3.0)
Eosinophils Absolute: 0.4 10*3/uL (ref 0.0–0.7)
Eosinophils Relative: 5.5 % — ABNORMAL HIGH (ref 0.0–5.0)
HCT: 38.7 % (ref 36.0–46.0)
Hemoglobin: 13 g/dL (ref 12.0–15.0)
Lymphocytes Relative: 19.4 % (ref 12.0–46.0)
Lymphs Abs: 1.3 10*3/uL (ref 0.7–4.0)
MCHC: 33.6 g/dL (ref 30.0–36.0)
MCV: 89.7 fl (ref 78.0–100.0)
Monocytes Absolute: 0.4 10*3/uL (ref 0.1–1.0)
Monocytes Relative: 5.6 % (ref 3.0–12.0)
Neutro Abs: 4.4 10*3/uL (ref 1.4–7.7)
Neutrophils Relative %: 68.9 % (ref 43.0–77.0)
Platelets: 249 10*3/uL (ref 150.0–400.0)
RBC: 4.31 Mil/uL (ref 3.87–5.11)
RDW: 13.8 % (ref 11.5–15.5)
WBC: 6.5 10*3/uL (ref 4.0–10.5)

## 2021-04-02 LAB — PROTIME-INR
INR: 1.1 ratio — ABNORMAL HIGH (ref 0.8–1.0)
Prothrombin Time: 11.9 s (ref 9.6–13.1)

## 2021-04-02 NOTE — Patient Instructions (Addendum)
We will perform lab work today We will work on setting up bronchoscopy to further evaluate your pulmonary nodules.  Hopefully we will arrange this for 04/20/2021.  You will need a designated driver and someone to watch you overnight after the test.  You will need to stop your aspirin 2 days prior to the test. Follow with Dr Delton Coombes in 1 month

## 2021-04-02 NOTE — H&P (View-Only) (Signed)
Subjective:    Patient ID: Karla Erickson, female    DOB: 05-01-1956, 65 y.o.   MRN: [id number]  HPI 65 year old woman with a history of tobacco (0.5 - 1 pack year), diabetes, hyperlipidemia, hypertension, hypothyroidism post benign nodule resection, GERD.  She has been seen in our office in the past for remote left upper lobe nodule for which she underwent lung biopsy that showed nonspecific inflammation without clear vasculitis (1998, Dr. Edwyna Shell), ultimately diagnosed with a suspected connective tissue disease with lung involvement per past pulmonary notes.   She referred today for abnormal chest imaging.  She had multiple serial chest x-rays that have shown stable nodular opacity at the right base including most recently 02/13/2021.  On the most recent scan there was question of a density on the lateral view which prompted CT chest.  This was done on 03/02/2021 reviewed by me, showed 7 mm spiculated nodule in the right apex, multiple additional pulmonary nodules in the right upper lobe, right middle lobe as well as the left lower lobe.  The dominant right lower lobe lesion measured 2.9 x 1.5 cm.  PET scan 03/14/2021 reviewed by me showed that the dominant 2.9 cm solid right lower lobe nodule is only mildly hypermetabolic (SUV 3.6).  The numerous additional solid bilateral pulmonary nodules are all negative for hypermetabolism, made below the size of PET scan sensitivity.  No hypermetabolic thoracic adenopathy or distant disease noted.  She reports joint pain, PIP joints, knees. Sometimes swelling. Rarely has aspiration events w cough, nmo cough at baseline. never hemoptysis. No hematuria or hx of renal dz.    Review of Systems As per HPI  Past Medical History:  Diagnosis Date  . Hyperlipidemia   . Hypertension   . Hypothyroid   . Pulmonary nodule      Family History  Problem Relation Age of Onset  . Breast cancer Neg Hx     No hx lung CA in the immediate family  Social History    Socioeconomic History  . Marital status: Married    Spouse name: Not on file  . Number of children: Not on file  . Years of education: Not on file  . Highest education level: Not on file  Occupational History  . Occupation: Disabled  Tobacco Use  . Smoking status: Former Smoker    Packs/day: 0.30    Years: 10.00    Pack years: 3.00    Types: Cigarettes    Start date: 59    Quit date: 12/06/1988    Years since quitting: 32.3  . Smokeless tobacco: Never Used  Substance and Sexual Activity  . Alcohol use: No  . Drug use: No  . Sexual activity: Not on file  Other Topics Concern  . Not on file  Social History Narrative  . Not on file   Social Determinants of Health   Financial Resource Strain: Not on file  Food Insecurity: No Food Insecurity  . Worried About Programme researcher, broadcasting/film/video in the Last Year: Never true  . Ran Out of Food in the Last Year: Never true  Transportation Needs: No Transportation Needs  . Lack of Transportation (Medical): No  . Lack of Transportation (Non-Medical): No  Physical Activity: Not on file  Stress: Not on file  Social Connections: Not on file  Intimate Partner Violence: Not on file    No hx TB exposure No known water damage or mold  She has worked as a Education officer, environmental, Company secretary   No  Known Allergies   Outpatient Medications Prior to Visit  Medication Sig Dispense Refill  . acetaminophen (TYLENOL) 325 MG tablet Take 650 mg by mouth every 6 (six) hours as needed.    Marland Kitchen amLODipine (NORVASC) 5 MG tablet 1 tablet once daily    . aspirin EC 81 MG tablet Take 81 mg by mouth daily.    Marland Kitchen atorvastatin (LIPITOR) 10 MG tablet 1 tablet daily    . betamethasone dipropionate 0.05 % cream Apply topically daily.    . hydrochlorothiazide (HYDRODIURIL) 25 MG tablet Take 25 mg by mouth as needed.    Marland Kitchen levothyroxine (SYNTHROID) 125 MCG tablet 1 tablet in the morning on an empty stomach    . metFORMIN (GLUCOPHAGE) 500 MG tablet Take 500 mg by mouth daily.    .  empagliflozin (JARDIANCE) 25 MG TABS tablet Take 25 mg by mouth daily. Pt states not taking due to causing yeast infections    . esomeprazole (NEXIUM) 40 MG capsule Take 40 mg by mouth daily at 12 noon.    Marland Kitchen levothyroxine (SYNTHROID, LEVOTHROID) 112 MCG tablet 1 tablet daily    . Trolamine Salicylate (ASPERCREME EX) Apply topically daily as needed.     No facility-administered medications prior to visit.         Objective:   Physical Exam Vitals:   04/02/21 0911  BP: 122/80  Pulse: 99  Temp: (!) 97.3 F (36.3 C)  TempSrc: Temporal  SpO2: 97%  Weight: 226 lb 12.8 oz (102.9 kg)  Height: 5\' 3"  (1.6 m)   Gen: Pleasant, overwt woman, in no distress,  normal affect  ENT: No lesions,  mouth clear,  oropharynx clear, no postnasal drip  Neck: No JVD, no stridor  Lungs: No use of accessory muscles, no crackles or wheezing on normal respiration, no wheeze on forced expiration  Cardiovascular: RRR, heart sounds normal, no murmur or gallops, no peripheral edema  Musculoskeletal: No deformities, no cyanosis or clubbing  Neuro: alert, awake, non focal  Skin: Warm, no lesions or rash     Assessment & Plan:  Pulmonary nodules/lesions, multiple Multiple pulmonary nodules noted on most recent CT, the dominant right lower lobe nodule appears to have been stable on serial chest x-rays for several years.  Etiology unclear.  Her lingular nodule biopsy in 1998 was consistent with nonspecific inflammation.  The speculated that she might have connective tissue disease but no formal diagnosis has been made.  We will perform ANCA, ANA, RF, ACE level to screen for possible autoimmune processes.  I recommended navigational bronchoscopy to sample nodule tissue, perform culture data.  She understands and agrees.  We will try to get this set for mid May.     June, MD, PhD 04/02/2021, 10:16 AM [facility] [phone] or if no answer before 7:00PM call  [phone] For any issues after 7:00PM please call eLink [phone]

## 2021-04-02 NOTE — Telephone Encounter (Signed)
Called and spoke with patient. Advised her of the information below. She verbalized understanding. Nothing further needed.

## 2021-04-02 NOTE — Telephone Encounter (Signed)
Attempted to reach pt to give procedure info. LVM and mailed letter with following info:  Covid test sched 04/17/21 at 10:05am.   ENB sched 5/16 at 7:30am; 5:30am arrival time at [facility] ENDO. NPO after midnight.   SuperD Disk being sent to [facility] per [facility] from [facility] [employer] via courier b/c they cannot send it to [facility] END[facility].

## 2021-04-02 NOTE — Progress Notes (Signed)
Subjective:    Patient ID: Karla Erickson, female    DOB: 05-01-1956, 65 y.o.   MRN: [id number]  HPI 65 year old woman with a history of tobacco (0.5 - 1 pack year), diabetes, hyperlipidemia, hypertension, hypothyroidism post benign nodule resection, GERD.  She has been seen in our office in the past for remote left upper lobe nodule for which she underwent lung biopsy that showed nonspecific inflammation without clear vasculitis (1998, Dr. Edwyna Shell), ultimately diagnosed with a suspected connective tissue disease with lung involvement per past pulmonary notes.   She referred today for abnormal chest imaging.  She had multiple serial chest x-rays that have shown stable nodular opacity at the right base including most recently 02/13/2021.  On the most recent scan there was question of a density on the lateral view which prompted CT chest.  This was done on 03/02/2021 reviewed by me, showed 7 mm spiculated nodule in the right apex, multiple additional pulmonary nodules in the right upper lobe, right middle lobe as well as the left lower lobe.  The dominant right lower lobe lesion measured 2.9 x 1.5 cm.  PET scan 03/14/2021 reviewed by me showed that the dominant 2.9 cm solid right lower lobe nodule is only mildly hypermetabolic (SUV 3.6).  The numerous additional solid bilateral pulmonary nodules are all negative for hypermetabolism, made below the size of PET scan sensitivity.  No hypermetabolic thoracic adenopathy or distant disease noted.  She reports joint pain, PIP joints, knees. Sometimes swelling. Rarely has aspiration events w cough, nmo cough at baseline. never hemoptysis. No hematuria or hx of renal dz.    Review of Systems As per HPI  Past Medical History:  Diagnosis Date  . Hyperlipidemia   . Hypertension   . Hypothyroid   . Pulmonary nodule      Family History  Problem Relation Age of Onset  . Breast cancer Neg Hx     No hx lung CA in the immediate family  Social History    Socioeconomic History  . Marital status: Married    Spouse name: Not on file  . Number of children: Not on file  . Years of education: Not on file  . Highest education level: Not on file  Occupational History  . Occupation: Disabled  Tobacco Use  . Smoking status: Former Smoker    Packs/day: 0.30    Years: 10.00    Pack years: 3.00    Types: Cigarettes    Start date: 59    Quit date: 12/06/1988    Years since quitting: 32.3  . Smokeless tobacco: Never Used  Substance and Sexual Activity  . Alcohol use: No  . Drug use: No  . Sexual activity: Not on file  Other Topics Concern  . Not on file  Social History Narrative  . Not on file   Social Determinants of Health   Financial Resource Strain: Not on file  Food Insecurity: No Food Insecurity  . Worried About Programme researcher, broadcasting/film/video in the Last Year: Never true  . Ran Out of Food in the Last Year: Never true  Transportation Needs: No Transportation Needs  . Lack of Transportation (Medical): No  . Lack of Transportation (Non-Medical): No  Physical Activity: Not on file  Stress: Not on file  Social Connections: Not on file  Intimate Partner Violence: Not on file    No hx TB exposure No known water damage or mold  She has worked as a Education officer, environmental, Company secretary   No  Known Allergies   Outpatient Medications Prior to Visit  Medication Sig Dispense Refill  . acetaminophen (TYLENOL) 325 MG tablet Take 650 mg by mouth every 6 (six) hours as needed.    . amLODipine (NORVASC) 5 MG tablet 1 tablet once daily    . aspirin EC 81 MG tablet Take 81 mg by mouth daily.    . atorvastatin (LIPITOR) 10 MG tablet 1 tablet daily    . betamethasone dipropionate 0.05 % cream Apply topically daily.    . hydrochlorothiazide (HYDRODIURIL) 25 MG tablet Take 25 mg by mouth as needed.    . levothyroxine (SYNTHROID) 125 MCG tablet 1 tablet in the morning on an empty stomach    . metFORMIN (GLUCOPHAGE) 500 MG tablet Take 500 mg by mouth daily.    .  empagliflozin (JARDIANCE) 25 MG TABS tablet Take 25 mg by mouth daily. Pt states not taking due to causing yeast infections    . esomeprazole (NEXIUM) 40 MG capsule Take 40 mg by mouth daily at 12 noon.    . levothyroxine (SYNTHROID, LEVOTHROID) 112 MCG tablet 1 tablet daily    . Trolamine Salicylate (ASPERCREME EX) Apply topically daily as needed.     No facility-administered medications prior to visit.         Objective:   Physical Exam Vitals:   04/02/21 0911  BP: 122/80  Pulse: 99  Temp: (!) 97.3 F (36.3 C)  TempSrc: Temporal  SpO2: 97%  Weight: 226 lb 12.8 oz (102.9 kg)  Height: 5' 3" (1.6 m)   Gen: Pleasant, overwt woman, in no distress,  normal affect  ENT: No lesions,  mouth clear,  oropharynx clear, no postnasal drip  Neck: No JVD, no stridor  Lungs: No use of accessory muscles, no crackles or wheezing on normal respiration, no wheeze on forced expiration  Cardiovascular: RRR, heart sounds normal, no murmur or gallops, no peripheral edema  Musculoskeletal: No deformities, no cyanosis or clubbing  Neuro: alert, awake, non focal  Skin: Warm, no lesions or rash     Assessment & Plan:  Pulmonary nodules/lesions, multiple Multiple pulmonary nodules noted on most recent CT, the dominant right lower lobe nodule appears to have been stable on serial chest x-rays for several years.  Etiology unclear.  Her lingular nodule biopsy in 1998 was consistent with nonspecific inflammation.  The speculated that she might have connective tissue disease but no formal diagnosis has been made.  We will perform ANCA, ANA, RF, ACE level to screen for possible autoimmune processes.  I recommended navigational bronchoscopy to sample nodule tissue, perform culture data.  She understands and agrees.  We will try to get this set for mid May.     Jocabed Cheese, MD, PhD 04/02/2021, 10:16 AM [place] [facility] [phone] or if no answer before 7:00PM call  [phone] For any issues after 7:00PM please call eLink [phone]  

## 2021-04-02 NOTE — Assessment & Plan Note (Signed)
Multiple pulmonary nodules noted on most recent CT, the dominant right lower lobe nodule appears to have been stable on serial chest x-rays for several years.  Etiology unclear.  Her lingular nodule biopsy in 1998 was consistent with nonspecific inflammation.  The speculated that she might have connective tissue disease but no formal diagnosis has been made.  We will perform ANCA, ANA, RF, ACE level to screen for possible autoimmune processes.  I recommended navigational bronchoscopy to sample nodule tissue, perform culture data.  She understands and agrees.  We will try to get this set for mid May.

## 2021-04-06 LAB — ANGIOTENSIN CONVERTING ENZYME: Angiotensin-Converting Enzyme: 24 U/L (ref 9–67)

## 2021-04-06 LAB — ANCA SCREEN W REFLEX TITER: ANCA Screen: NEGATIVE

## 2021-04-06 LAB — ANA: Anti Nuclear Antibody (ANA): NEGATIVE

## 2021-04-06 LAB — RHEUMATOID FACTOR: Rheumatoid fact SerPl-aCnc: <14 IU/mL (ref <14)

## 2021-04-13 ENCOUNTER — Ambulatory Visit: Payer: Self-pay

## 2021-04-16 ENCOUNTER — Other Ambulatory Visit: Payer: Self-pay

## 2021-04-16 ENCOUNTER — Encounter (HOSPITAL_COMMUNITY): Payer: Self-pay | Admitting: Emergency Medicine

## 2021-04-16 NOTE — Progress Notes (Signed)
PCP - Dr Maurice Small Cardiologist - n/a  Chest x-ray - 02/13/21 (2V) EKG - DOS 04/20/21 Stress Test - n/a ECHO - n/a Cardiac Cath - n/a  Fasting Blood Sugar - 130s-150s Checks Blood Sugar 1 times a day  . Do not take metformin on the morning of surgery.  . If your blood sugar is less than 70 mg/dL, you will need to treat for low blood sugar: o Treat a low blood sugar (less than 70 mg/dL) with  cup of clear juice (cranberry or apple), 4 glucose tablets, OR glucose gel. o Recheck blood sugar in 15 minutes after treatment (to make sure it is greater than 70 mg/dL). If your blood sugar is not greater than 70 mg/dL on recheck, call [phone] for further instructions.  Aspirin Instructions:  Per patient, last dose of ASA will be on Friday 04/17/21 per MD's instructions.Marland Kitchen  STOP now taking any Aspirin (unless otherwise instructed by your surgeon), Aleve, Naproxen, Ibuprofen, Motrin, Advil, Goody's, BC's, all herbal medications, fish oil, and all vitamins.   Coronavirus Screening Covid test is scheduled on Friday, 04/17/21 Do you have any of the following symptoms:  Cough yes/no: No Fever (>100.51F)  yes/no: No Runny nose yes/no: No Sore throat yes/no: No Difficulty breathing/shortness of breath  yes/no: No  Have you traveled in the last 14 days and where? yes/no: No  Patient verbalized understanding of instructions that were given via phone.

## 2021-04-17 ENCOUNTER — Other Ambulatory Visit (HOSPITAL_COMMUNITY)
Admission: RE | Admit: 2021-04-17 | Discharge: 2021-04-17 | Disposition: A | Discharge status: Home / Self Care | Payer: HMO | Source: Ambulatory Visit | Attending: Emergency Medicine | Admitting: Emergency Medicine

## 2021-04-17 DIAGNOSIS — Z01812 Encounter for preprocedural laboratory examination: Secondary | ICD-10-CM | POA: Diagnosis present

## 2021-04-17 DIAGNOSIS — Z20822 Contact with and (suspected) exposure to covid-19: Secondary | ICD-10-CM | POA: Insufficient documentation

## 2021-04-17 LAB — SARS CORONAVIRUS 2 (TAT 6-24 HRS): SARS Coronavirus 2: NEGATIVE

## 2021-04-20 ENCOUNTER — Encounter (HOSPITAL_COMMUNITY): Payer: Self-pay | Admitting: Emergency Medicine

## 2021-04-20 ENCOUNTER — Ambulatory Visit (HOSPITAL_COMMUNITY): Payer: HMO | Admitting: Certified Registered Nurse Anesthetist

## 2021-04-20 ENCOUNTER — Ambulatory Visit (HOSPITAL_COMMUNITY)
Admission: RE | Admit: 2021-04-20 | Discharge: 2021-04-20 | Disposition: A | Discharge status: Home / Self Care | Payer: HMO | Attending: Emergency Medicine | Admitting: Emergency Medicine

## 2021-04-20 ENCOUNTER — Other Ambulatory Visit: Payer: Self-pay

## 2021-04-20 ENCOUNTER — Ambulatory Visit (HOSPITAL_COMMUNITY): Payer: HMO

## 2021-04-20 ENCOUNTER — Encounter (HOSPITAL_COMMUNITY)
Admission: RE | Disposition: A | Discharge status: Home / Self Care | Payer: Self-pay | Source: Home / Self Care | Attending: Emergency Medicine

## 2021-04-20 DIAGNOSIS — E785 Hyperlipidemia, unspecified: Secondary | ICD-10-CM | POA: Insufficient documentation

## 2021-04-20 DIAGNOSIS — I509 Heart failure, unspecified: Secondary | ICD-10-CM

## 2021-04-20 DIAGNOSIS — E039 Hypothyroidism, unspecified: Secondary | ICD-10-CM | POA: Insufficient documentation

## 2021-04-20 DIAGNOSIS — R918 Other nonspecific abnormal finding of lung field: Secondary | ICD-10-CM | POA: Diagnosis not present

## 2021-04-20 DIAGNOSIS — E119 Type 2 diabetes mellitus without complications: Secondary | ICD-10-CM | POA: Diagnosis not present

## 2021-04-20 DIAGNOSIS — K219 Gastro-esophageal reflux disease without esophagitis: Secondary | ICD-10-CM | POA: Insufficient documentation

## 2021-04-20 DIAGNOSIS — Z79899 Other long term (current) drug therapy: Secondary | ICD-10-CM | POA: Diagnosis not present

## 2021-04-20 DIAGNOSIS — Z419 Encounter for procedure for purposes other than remedying health state, unspecified: Secondary | ICD-10-CM

## 2021-04-20 DIAGNOSIS — Z7984 Long term (current) use of oral hypoglycemic drugs: Secondary | ICD-10-CM | POA: Diagnosis not present

## 2021-04-20 DIAGNOSIS — I10 Essential (primary) hypertension: Secondary | ICD-10-CM | POA: Diagnosis not present

## 2021-04-20 DIAGNOSIS — Z87891 Personal history of nicotine dependence: Secondary | ICD-10-CM | POA: Insufficient documentation

## 2021-04-20 DIAGNOSIS — Z7982 Long term (current) use of aspirin: Secondary | ICD-10-CM | POA: Diagnosis not present

## 2021-04-20 DIAGNOSIS — R198 Other specified symptoms and signs involving the digestive system and abdomen: Secondary | ICD-10-CM | POA: Insufficient documentation

## 2021-04-20 DIAGNOSIS — Z79890 Hormone replacement therapy (postmenopausal): Secondary | ICD-10-CM | POA: Diagnosis not present

## 2021-04-20 HISTORY — PX: VIDEO BRONCHOSCOPY WITH ENDOBRONCHIAL NAVIGATION: SHX6175

## 2021-04-20 HISTORY — PX: BRONCHIAL BIOPSY: SHX5109

## 2021-04-20 HISTORY — PX: BRONCHIAL BRUSHINGS: SHX5108

## 2021-04-20 HISTORY — PX: BRONCHIAL NEEDLE ASPIRATION BIOPSY: SHX5106

## 2021-04-20 HISTORY — DX: Type 2 diabetes mellitus without complications: E11.9

## 2021-04-20 HISTORY — PX: BRONCHIAL WASHINGS: SHX5105

## 2021-04-20 HISTORY — DX: Gastro-esophageal reflux disease without esophagitis: K21.9

## 2021-04-20 LAB — BASIC METABOLIC PANEL
Anion gap: 5 (ref 5–15)
BUN: 8 mg/dL (ref 8–23)
CO2: 28 mmol/L (ref 22–32)
Calcium: 9.4 mg/dL (ref 8.9–10.3)
Chloride: 106 mmol/L (ref 98–111)
Creatinine, Ser: 0.9 mg/dL (ref 0.44–1.00)
GFR, Estimated: >60 mL/min (ref >60)
Glucose, Bld: 144 mg/dL — ABNORMAL HIGH (ref 70–99)
Potassium: 4 mmol/L (ref 3.5–5.1)
Sodium: 139 mmol/L (ref 135–145)

## 2021-04-20 LAB — GLUCOSE, CAPILLARY: Glucose-Capillary: 131 mg/dL — ABNORMAL HIGH (ref 70–99)

## 2021-04-20 SURGERY — VIDEO BRONCHOSCOPY WITH ENDOBRONCHIAL NAVIGATION
Anesthesia: General

## 2021-04-20 MED ORDER — DEXAMETHASONE SODIUM PHOSPHATE 10 MG/ML IJ SOLN
INTRAMUSCULAR | Status: DC | PRN
  Administered 2021-04-20: 5 mg via INTRAVENOUS

## 2021-04-20 MED ORDER — PHENYLEPHRINE 40 MCG/ML (10ML) SYRINGE FOR IV PUSH (FOR BLOOD PRESSURE SUPPORT)
PREFILLED_SYRINGE | INTRAVENOUS | Status: DC | PRN
  Administered 2021-04-20 (×3): 80 ug via INTRAVENOUS

## 2021-04-20 MED ORDER — FENTANYL CITRATE (PF) 100 MCG/2ML IJ SOLN: 25.0000 ug | INTRAMUSCULAR | Status: DC | PRN

## 2021-04-20 MED ORDER — ONDANSETRON HCL 4 MG/2ML IJ SOLN: 4.0000 mg | Freq: Four times a day (QID) | INTRAMUSCULAR | Status: DC | PRN

## 2021-04-20 MED ORDER — LACTATED RINGERS IV SOLN: INTRAVENOUS | Status: DC | PRN

## 2021-04-20 MED ORDER — SUGAMMADEX SODIUM 200 MG/2ML IV SOLN
INTRAVENOUS | Status: DC | PRN
  Administered 2021-04-20: 100 mg via INTRAVENOUS
  Administered 2021-04-20: 200 mg via INTRAVENOUS

## 2021-04-20 MED ORDER — LACTATED RINGERS IV SOLN: INTRAVENOUS | Status: DC

## 2021-04-20 MED ORDER — ROCURONIUM BROMIDE 100 MG/10ML IV SOLN
INTRAVENOUS | Status: DC | PRN
  Administered 2021-04-20: 50 mg via INTRAVENOUS
  Administered 2021-04-20: 20 mg via INTRAVENOUS

## 2021-04-20 MED ORDER — METOPROLOL SUCCINATE ER 25 MG PO TB24: 25.0000 mg | ORAL_TABLET | Freq: Once | ORAL | Status: DC

## 2021-04-20 MED ORDER — LIDOCAINE 2% (20 MG/ML) 5 ML SYRINGE
INTRAMUSCULAR | Status: DC | PRN
  Administered 2021-04-20: 50 mg via INTRAVENOUS

## 2021-04-20 MED ORDER — OXYCODONE HCL 5 MG/5ML PO SOLN: 5.0000 mg | Freq: Once | ORAL | Status: DC | PRN

## 2021-04-20 MED ORDER — CHLORHEXIDINE GLUCONATE 0.12 % MT SOLN
OROMUCOSAL | Status: AC
  Administered 2021-04-20: 15 mL
  Filled 2021-04-20: qty 15

## 2021-04-20 MED ORDER — PROPOFOL 10 MG/ML IV BOLUS
INTRAVENOUS | Status: DC | PRN
  Administered 2021-04-20: 120 mg via INTRAVENOUS

## 2021-04-20 MED ORDER — MIDAZOLAM HCL 5 MG/5ML IJ SOLN
INTRAMUSCULAR | Status: DC | PRN
  Administered 2021-04-20 (×2): 1 mg via INTRAVENOUS

## 2021-04-20 MED ORDER — ONDANSETRON HCL 4 MG/2ML IJ SOLN
INTRAMUSCULAR | Status: DC | PRN
  Administered 2021-04-20: 4 mg via INTRAVENOUS

## 2021-04-20 MED ORDER — PHENYLEPHRINE HCL-NACL 10-0.9 MG/250ML-% IV SOLN
INTRAVENOUS | Status: DC | PRN
  Administered 2021-04-20: 25 ug/min via INTRAVENOUS

## 2021-04-20 MED ORDER — OXYCODONE HCL 5 MG PO TABS: 5.0000 mg | ORAL_TABLET | Freq: Once | ORAL | Status: DC | PRN

## 2021-04-20 MED ORDER — FENTANYL CITRATE (PF) 250 MCG/5ML IJ SOLN
INTRAMUSCULAR | Status: DC | PRN
  Administered 2021-04-20 (×2): 50 ug via INTRAVENOUS

## 2021-04-20 NOTE — Op Note (Signed)
Video Bronchoscopy with Electromagnetic Navigation Procedure Note  Date of Operation: 04/20/2021  Pre-op Diagnosis: multiple pulmonary nodules  Post-op Diagnosis: Same  Surgeon: Baltazar Apo  Assistants: None  Anesthesia: General endotracheal anesthesia  Operation: Flexible video fiberoptic bronchoscopy with electromagnetic navigation and biopsies.  Estimated Blood Loss: Minimal  Complications: None apparent  Indications and History: Karla Erickson is a 65 y.o. female with history of bilateral pulmonary nodules.  Recommendation was made to achieve tissue diagnosis and to obtain culture data via navigational bronchoscopy.  The risks, benefits, complications, treatment options and expected outcomes were discussed with the patient.  The possibilities of pneumothorax, pneumonia, reaction to medication, pulmonary aspiration, perforation of a viscus, bleeding, failure to diagnose a condition and creating a complication requiring transfusion or operation were discussed with the patient who freely signed the consent.    Description of Procedure: The patient was seen in the Preoperative Area, was examined and was deemed appropriate to proceed.  The patient was taken to [facility] endoscopy room 2, identified as Karla Erickson and the procedure verified as Flexible Video Fiberoptic Bronchoscopy.  A Time Out was held and the above information confirmed.   Prior to the date of the procedure a high-resolution CT scan of the chest was performed. Utilizing [place] a virtual tracheobronchial tree was generated to allow the creation of distinct navigation pathways to the patient's parenchymal abnormalities. After being taken to the operating room general anesthesia was initiated and the patient  was orally intubated. The video fiberoptic bronchoscope was introduced via the endotracheal tube and a general inspection was performed which showed normal airways throughout. The extendable working  channel and locator guide were introduced into the bronchoscope. The distinct navigation pathways prepared prior to this procedure were then utilized to navigate to within 0.3-1.5 cm of patient's lesion identified on CT scan: -Target 1 predominant right lower lobe nodule -Target 2 spiculated right upper lobe nodule -Target 3 most inferior left lower lobe nodule  The extendable working channel was secured into place at each location and the locator guide was withdrawn. Under fluoroscopic guidance transbronchial brushings were obtained attained at each location.  Transbronchial Wang needle biopsies were performed at the left lower lobe nodule (target 3).  Transbronchial forceps biopsies were performed at the left lower lobe nodule (target 3).  Bronchoalveolar lavage was performed adjacent to each target.  These were sent for cytology and microbiology  At the end of the procedure a general airway inspection was performed and there was no evidence of active bleeding. The bronchoscope was removed.  The patient tolerated the procedure well. There was no significant blood loss and there were no obvious complications. A post-procedural chest x-ray is pending.  Samples: 1. Transbronchial brushings from right lower lobe nodule (1), right upper lobe nodule (2), left lower lobe nodule (3) 2. Transbronchial Wang needle biopsies from left lower lobe nodule (target 3) 3. Transbronchial forceps biopsies from left lower lobe nodule (target 3) 4. Bronchoalveolar lavage from right lower lobe, right upper lobe, left lower lobe  Plans:  The patient will be discharged from the PACU to home when recovered from anesthesia and after chest x-ray is reviewed. We will review the cytology, pathology and microbiology results with the patient when they become available. Outpatient followup will be with Dr. Lamonte Sakai.    Karla Erickson S. 04/20/2021

## 2021-04-20 NOTE — Interval H&P Note (Signed)
History and Physical Interval Note:  04/20/2021 7:32 AM  Karla Erickson  has presented today for surgery, with the diagnosis of LUNG NODULE.  The various methods of treatment have been discussed with the patient and family. After consideration of risks, benefits and other options for treatment, the patient has consented to  Procedure(s): VIDEO BRONCHOSCOPY WITH ENDOBRONCHIAL NAVIGATION (N/A) as a surgical intervention.  The patient's history has been reviewed, patient examined, no change in status, stable for surgery.  I have reviewed the patient's chart and labs.  Questions were answered to the patient's satisfaction.     Leslye Peer

## 2021-04-20 NOTE — Anesthesia Procedure Notes (Addendum)
Procedure Name: Intubation Date/Time: 04/20/2021 7:51 AM Performed by: Janene Harvey, CRNA Pre-anesthesia Checklist: Patient identified, Emergency Drugs available, Suction available and Patient being monitored Patient Re-evaluated:Patient Re-evaluated prior to induction Oxygen Delivery Method: Circle system utilized Preoxygenation: Pre-oxygenation with 100% oxygen Induction Type: IV induction Ventilation: Mask ventilation without difficulty and Oral airway inserted - appropriate to patient size Laryngoscope Size: Mac and 4 Grade View: Grade I Tube type: Oral Tube size: 8.5 mm Number of attempts: 1 Airway Equipment and Method: Stylet and Oral airway Placement Confirmation: ETT inserted through vocal cords under direct vision,  positive ETCO2 and breath sounds checked- equal and bilateral Secured at: 22 cm Tube secured with: Tape Dental Injury: Teeth and Oropharynx as per pre-operative assessment  Comments: Placed by [facility]

## 2021-04-20 NOTE — Anesthesia Preprocedure Evaluation (Signed)
Anesthesia Evaluation  Patient identified by MRN, date of birth, ID band Patient awake    Reviewed: Allergy & Precautions, H&P , NPO status , Patient's Chart, lab work & pertinent test results  Airway Mallampati: II   Neck ROM: full    Dental   Pulmonary former smoker,  Lung nodule   breath sounds clear to auscultation       Cardiovascular hypertension,  Rhythm:regular Rate:Normal     Neuro/Psych    GI/Hepatic GERD  ,  Endo/Other  diabetes, Type 2Hypothyroidism   Renal/GU      Musculoskeletal   Abdominal   Peds  Hematology   Anesthesia Other Findings   Reproductive/Obstetrics                             Anesthesia Physical Anesthesia Plan  ASA: II  Anesthesia Plan: General   Post-op Pain Management:    Induction: Intravenous  PONV Risk Score and Plan: 3 and Ondansetron, Dexamethasone, Midazolam and Treatment may vary due to age or medical condition  Airway Management Planned: Oral ETT  Additional Equipment:   Intra-op Plan:   Post-operative Plan: Extubation in OR  Informed Consent: I have reviewed the patients History and Physical, chart, labs and discussed the procedure including the risks, benefits and alternatives for the proposed anesthesia with the patient or authorized representative who has indicated his/her understanding and acceptance.     Dental advisory given  Plan Discussed with: CRNA, Anesthesiologist and Surgeon  Anesthesia Plan Comments:         Anesthesia Quick Evaluation

## 2021-04-20 NOTE — Transfer of Care (Signed)
Immediate Anesthesia Transfer of Care Note  Patient: Karla Erickson  Procedure(s) Performed: VIDEO BRONCHOSCOPY WITH ENDOBRONCHIAL NAVIGATION (N/A ) BRONCHIAL BIOPSIES BRONCHIAL BRUSHINGS BRONCHIAL NEEDLE ASPIRATION BIOPSIES BRONCHIAL WASHINGS  Patient Location: Endoscopy Unit  Anesthesia Type:General  Level of Consciousness: drowsy  Airway & Oxygen Therapy: Patient Spontanous Breathing and Patient connected to face mask oxygen  Post-op Assessment: Report given to RN and Post -op Vital signs reviewed and stable  Post vital signs: Reviewed and stable  Last Vitals:  Vitals Value Taken Time  BP 150/72 04/20/21 0939  Temp    Pulse 98 04/20/21 0940  Resp 18 04/20/21 0940  SpO2 98 % 04/20/21 0940  Vitals shown include unvalidated device data.  Last Pain:  Vitals:   04/20/21 0626  TempSrc:   PainSc: 0-No pain      Patients Stated Pain Goal: 3 (04/16/21 1532)  Complications: No complications documented.

## 2021-04-20 NOTE — Discharge Instructions (Signed)
Flexible Bronchoscopy, Care After This sheet gives you information about how to care for yourself after your test. Your doctor may also give you more specific instructions. If you have problems or questions, contact your doctor. Follow these instructions at home: Eating and drinking  Do not eat or drink anything (not even water) for 2 hours after your test, or until your numbing medicine (local anesthetic) wears off.  When your numbness is gone and your cough and gag reflexes have come back, you may: ? Eat only soft foods. ? Slowly drink liquids.  The day after the test, go back to your normal diet. Driving  Do not drive for 24 hours if you were given a medicine to help you relax (sedative).  Do not drive or use heavy machinery while taking prescription pain medicine. General instructions   Take over-the-counter and prescription medicines only as told by your doctor.  Return to your normal activities as told. Ask what activities are safe for you.  Do not use any products that have nicotine or tobacco in them. This includes cigarettes and e-cigarettes. If you need help quitting, ask your doctor.  Keep all follow-up visits as told by your doctor. This is important. It is very important if you had a tissue sample (biopsy) taken. Get help right away if:  You have shortness of breath that gets worse.  You get light-headed.  You feel like you are going to pass out (faint).  You have chest pain.  You cough up: ? More than a little blood. ? More blood than before. Summary  Do not eat or drink anything (not even water) for 2 hours after your test, or until your numbing medicine wears off.  Do not use cigarettes. Do not use e-cigarettes.  Get help right away if you have chest pain.  You may restart your aspirin on 04/21/2021. Please call our office for any questions or concerns.  [phone].  This information is not intended to replace advice given to you by your health care  provider. Make sure you discuss any questions you have with your health care provider. Document Released: 09/19/2009 Document Revised: 11/04/2017 Document Reviewed: 12/10/2016 Elsevier Patient Education  2020 [employer].

## 2021-04-21 ENCOUNTER — Encounter (HOSPITAL_COMMUNITY): Payer: Self-pay | Admitting: Emergency Medicine

## 2021-04-21 LAB — ACID FAST SMEAR (AFB, MYCOBACTERIA)
Acid Fast Smear: NEGATIVE
Acid Fast Smear: NEGATIVE

## 2021-04-21 NOTE — Anesthesia Postprocedure Evaluation (Signed)
Anesthesia Post Note  Patient: Karla Erickson  Procedure(s) Performed: VIDEO BRONCHOSCOPY WITH ENDOBRONCHIAL NAVIGATION (N/A ) BRONCHIAL BIOPSIES BRONCHIAL BRUSHINGS BRONCHIAL NEEDLE ASPIRATION BIOPSIES BRONCHIAL WASHINGS     Patient location during evaluation: PACU Anesthesia Type: General Level of consciousness: awake and alert Pain management: pain level controlled Vital Signs Assessment: post-procedure vital signs reviewed and stable Respiratory status: spontaneous breathing, nonlabored ventilation, respiratory function stable and patient connected to nasal cannula oxygen Cardiovascular status: blood pressure returned to baseline and stable Postop Assessment: no apparent nausea or vomiting Anesthetic complications: no   No complications documented.  Last Vitals:  Vitals:   04/20/21 1020 04/20/21 1030  BP: (!) 141/84 (!) 142/74  Pulse: 96 95  Resp: (!) 26 (!) 27  Temp:    SpO2: 90% 90%    Last Pain:  Vitals:   04/20/21 1020  TempSrc:   PainSc: 0-No pain                 Ilisha Blust S

## 2021-04-22 LAB — CYTOLOGY - NON PAP

## 2021-04-22 LAB — CULTURE, BAL-QUANTITATIVE W GRAM STAIN
Culture: 10000 — AB
Culture: 10000 — AB
Gram Stain: NONE SEEN

## 2021-04-23 ENCOUNTER — Telehealth: Payer: Self-pay | Admitting: Emergency Medicine

## 2021-04-23 NOTE — Telephone Encounter (Signed)
Please let the patient know that none of her bronchoscopy samples showed any evidence for cancer. All of her cultures are still pending. We will need to follow up to discuss cx results and next steps to follow nodules. Thanks.

## 2021-04-23 NOTE — Telephone Encounter (Signed)
Reviewed bronchoscopy results with pt and confirmed OV on 05/07/21. Pt stated understanding and no further question or concerns at this time.

## 2021-05-07 ENCOUNTER — Encounter: Payer: Self-pay | Admitting: Emergency Medicine

## 2021-05-07 ENCOUNTER — Ambulatory Visit: Payer: HMO | Admitting: Emergency Medicine

## 2021-05-07 ENCOUNTER — Other Ambulatory Visit: Payer: Self-pay

## 2021-05-07 VITALS — BP 124/70 | HR 103 | Temp 97.2°F | Ht 63.0 in | Wt 223.2 lb

## 2021-05-07 DIAGNOSIS — R918 Other nonspecific abnormal finding of lung field: Secondary | ICD-10-CM

## 2021-05-07 NOTE — Assessment & Plan Note (Addendum)
Etiology unclear.  No evidence of infection, granulomas, malignancy.  Need to be followed with serial films.  Next will be in 6 months, October 2023.  Sooner if she develops any new respiratory symptoms.  Her ANA, ANCA, RF, ACE level were all negative.  Your bronchoscopy cytologies were all negative, cultures are negative.  This is good news. We will repeat your CT scan of the chest and October 2023 to follow your pulmonary nodules Please follow Dr. Delton Coombes in October after your CT scan so that we can review the results together.

## 2021-05-07 NOTE — Patient Instructions (Signed)
Your bronchoscopy cytologies were all negative, cultures are negative.  This is good news. We will repeat your CT scan of the chest and October 2023 to follow your pulmonary nodules Please follow Dr. Delton Coombes in October after your CT scan so that we can review the results together.

## 2021-05-07 NOTE — Progress Notes (Signed)
Subjective:    Patient ID: Karla Erickson, female    DOB: 1956-06-04, 65 y.o.   MRN: [id number]  HPI 65 year old woman with a history of tobacco (0.5 - 1 pack year), diabetes, hyperlipidemia, hypertension, hypothyroidism post benign nodule resection, GERD.  She has been seen in our office in the past for remote left upper lobe nodule for which she underwent lung biopsy that showed nonspecific inflammation without clear vasculitis (1998, Dr. Edwyna Shell), ultimately diagnosed with a suspected connective tissue disease with lung involvement per past pulmonary notes.   She referred today for abnormal chest imaging.  She had multiple serial chest x-rays that have shown stable nodular opacity at the right base including most recently 02/13/2021.  On the most recent scan there was question of a density on the lateral view which prompted CT chest.  This was done on 03/02/2021 reviewed by me, showed 7 mm spiculated nodule in the right apex, multiple additional pulmonary nodules in the right upper lobe, right middle lobe as well as the left lower lobe.  The dominant right lower lobe lesion measured 2.9 x 1.5 cm.  PET scan 03/14/2021 reviewed by me showed that the dominant 2.9 cm solid right lower lobe nodule is only mildly hypermetabolic (SUV 3.6).  The numerous additional solid bilateral pulmonary nodules are all negative for hypermetabolism, made below the size of PET scan sensitivity.  No hypermetabolic thoracic adenopathy or distant disease noted.  She reports joint pain, PIP joints, knees. Sometimes swelling. Rarely has aspiration events w cough, nmo cough at baseline. never hemoptysis. No hematuria or hx of renal dz.   ROV 05/07/21 --follow-up visit for 65 year old woman with a minimal tobacco history, remote left upper lobe biopsy for pulmonary nodule that was not malignant, not vasculitis (1998).  It was suspected to possibly be related to connective tissue disease.  I saw her for evaluation of pulmonary nodular  disease on CT chest 03/02/2021.  A predominant 2.9 cm solid right lower lobe nodule was mildly hypermetabolic on PET scan 03/14/2021.  Numerous other bilateral pulmonary nodules were below the size detectability. She underwent bronchoscopy 04/20/2021 with sampling of the predominant right lower lobe pulmonary nodule, a spiculated right upper lobe nodule and an inferior left lower lobe nodule.  All the cytology was negative for malignancy.  Fungal and AFB cultures are negative  Serologies 04/02/2021 negative ANA, ANCA, ACE level, RF.   Review of Systems As per HPI      Objective:   Physical Exam Vitals:   05/07/21 1015  BP: 124/70  Pulse: (!) 103  Temp: (!) 97.2 F (36.2 C)  TempSrc: Temporal  SpO2: 94%  Weight: 223 lb 3.2 oz (101.2 kg)  Height: 5\' 3"  (1.6 m)   Gen: Pleasant, overwt woman, in no distress,  normal affect  ENT: No lesions,  mouth clear,  oropharynx clear, no postnasal drip  Neck: No JVD, no stridor  Lungs: No use of accessory muscles, no crackles or wheezing on normal respiration, no wheeze on forced expiration  Cardiovascular: RRR, heart sounds normal, no murmur or gallops, no peripheral edema  Musculoskeletal: No deformities, no cyanosis or clubbing  Neuro: alert, awake, non focal  Skin: Warm, no lesions or rash     Assessment & Plan:  Pulmonary nodules/lesions, multiple Etiology unclear.  No evidence of infection, granulomas, malignancy.  Need to be followed with serial films.  Next will be in 6 months, October 2023.  Sooner if she develops any new respiratory symptoms.  Her ANA,  ANCA, RF, ACE level were all negative.  Your bronchoscopy cytologies were all negative, cultures are negative.  This is good news. We will repeat your CT scan of the chest and October 2023 to follow your pulmonary nodules Please follow Dr. Delton Coombes in October after your CT scan so that we can review the results together.   Levy Pupa, MD, PhD 05/07/2021, 1:40 PM  Pulmonary  and Critical Care [phone] or if no answer before 7:00PM call [phone] For any issues after 7:00PM please call eLink [phone]

## 2021-05-19 LAB — FUNGUS CULTURE RESULT

## 2021-05-19 LAB — FUNGUS CULTURE WITH STAIN

## 2021-05-19 LAB — FUNGAL ORGANISM REFLEX

## 2021-06-07 LAB — ACID FAST CULTURE WITH REFLEXED SENSITIVITIES (MYCOBACTERIA)
Acid Fast Culture: NEGATIVE
Acid Fast Culture: NEGATIVE

## 2021-08-19 ENCOUNTER — Other Ambulatory Visit: Payer: Self-pay | Admitting: Family Medicine

## 2021-08-19 DIAGNOSIS — Z1231 Encounter for screening mammogram for malignant neoplasm of breast: Secondary | ICD-10-CM

## 2021-09-15 ENCOUNTER — Inpatient Hospital Stay: Admission: RE | Admit: 2021-09-15 | Payer: HMO | Source: Ambulatory Visit

## 2021-09-24 ENCOUNTER — Other Ambulatory Visit: Payer: Self-pay

## 2021-09-24 ENCOUNTER — Ambulatory Visit
Admission: RE | Admit: 2021-09-24 | Discharge: 2021-09-24 | Disposition: A | Discharge status: Home / Self Care | Payer: HMO | Source: Ambulatory Visit | Attending: Family Medicine | Admitting: Family Medicine

## 2021-09-24 DIAGNOSIS — Z1231 Encounter for screening mammogram for malignant neoplasm of breast: Secondary | ICD-10-CM

## 2022-04-29 ENCOUNTER — Other Ambulatory Visit: Payer: Self-pay | Admitting: Internal Medicine

## 2022-04-29 DIAGNOSIS — M1990 Unspecified osteoarthritis, unspecified site: Secondary | ICD-10-CM | POA: Diagnosis not present

## 2022-04-29 DIAGNOSIS — Z0000 Encounter for general adult medical examination without abnormal findings: Secondary | ICD-10-CM | POA: Diagnosis not present

## 2022-04-29 DIAGNOSIS — I10 Essential (primary) hypertension: Secondary | ICD-10-CM | POA: Diagnosis not present

## 2022-04-29 DIAGNOSIS — M8589 Other specified disorders of bone density and structure, multiple sites: Secondary | ICD-10-CM

## 2022-04-29 DIAGNOSIS — E039 Hypothyroidism, unspecified: Secondary | ICD-10-CM | POA: Diagnosis not present

## 2022-04-29 DIAGNOSIS — E7800 Pure hypercholesterolemia, unspecified: Secondary | ICD-10-CM | POA: Diagnosis not present

## 2022-04-29 DIAGNOSIS — J849 Interstitial pulmonary disease, unspecified: Secondary | ICD-10-CM | POA: Diagnosis not present

## 2022-04-29 DIAGNOSIS — I700 Atherosclerosis of aorta: Secondary | ICD-10-CM | POA: Diagnosis not present

## 2022-04-29 DIAGNOSIS — E1169 Type 2 diabetes mellitus with other specified complication: Secondary | ICD-10-CM | POA: Diagnosis not present

## 2022-06-15 IMAGING — CT CT CHEST W/O CM
1 series · 15 of 34 positions shown, 19 images · non-contrast
Comparison: Chest x-ray 02/13/2021.

CLINICAL DATA: Pulmonary nodules. New density on chest x-ray
02/13/2021.

EXAM:
CT CHEST WITHOUT CONTRAST
TECHNIQUE: Multidetector CT imaging of the chest was performed following the
standard protocol without IV contrast.

[Series 2: chest w/(date) · axial · 0.94mm/px · z∈[-289,-37]mm · 15 of 148 slices shown, 19 images]
[im 11/148  mediastinal]
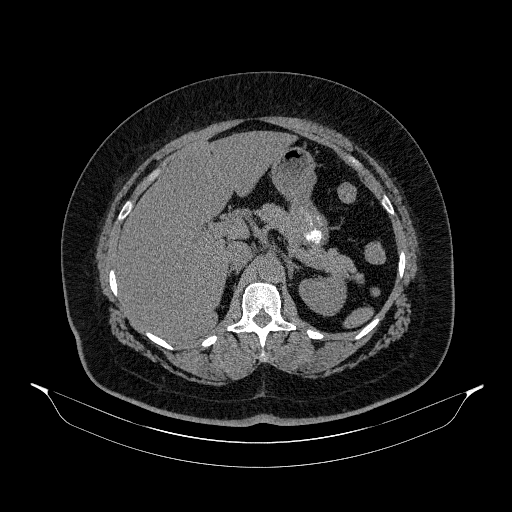
[im 11/148  lung]
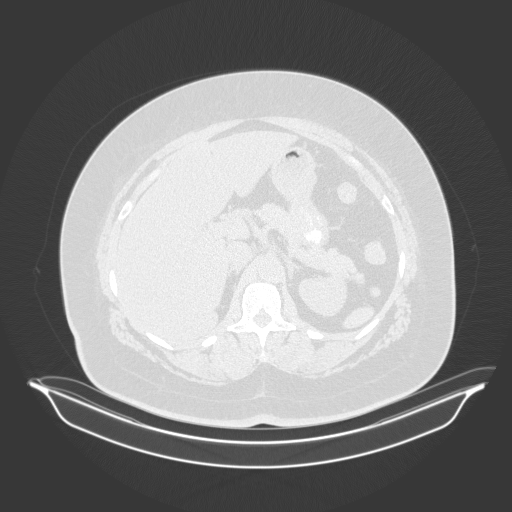
[im 22/148  lung]
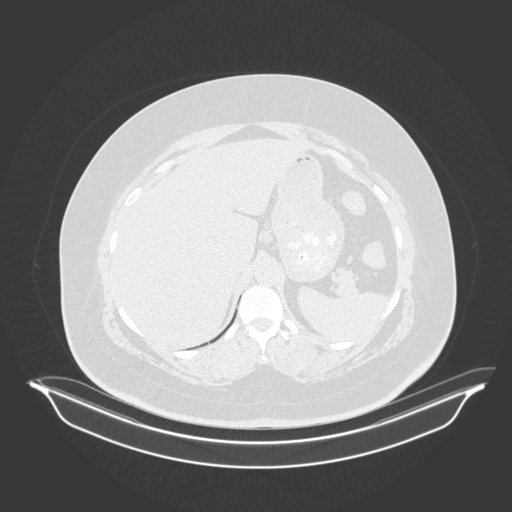
[im 30/148  lung]
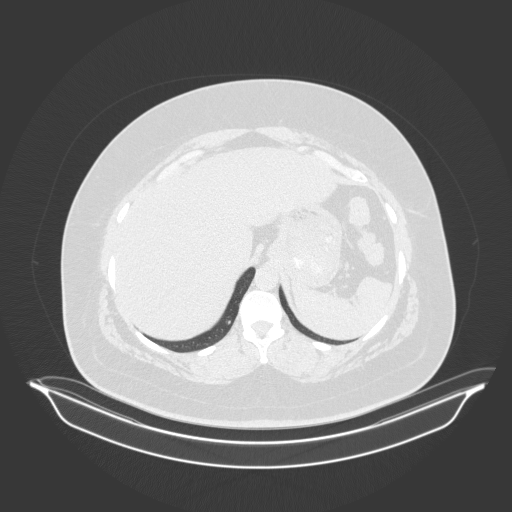
[im 39/148  lung]
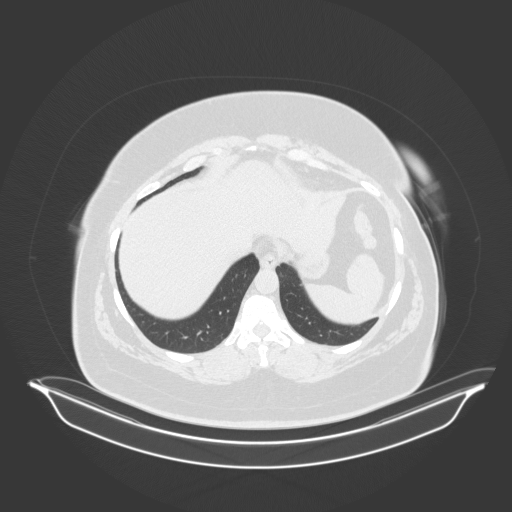
[im 50/148  mediastinal]
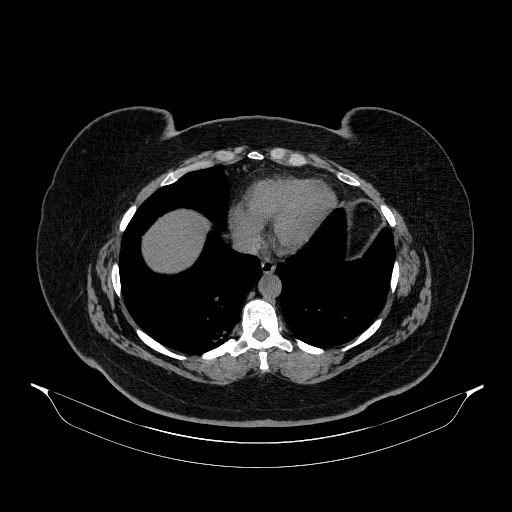
[im 50/148  lung]
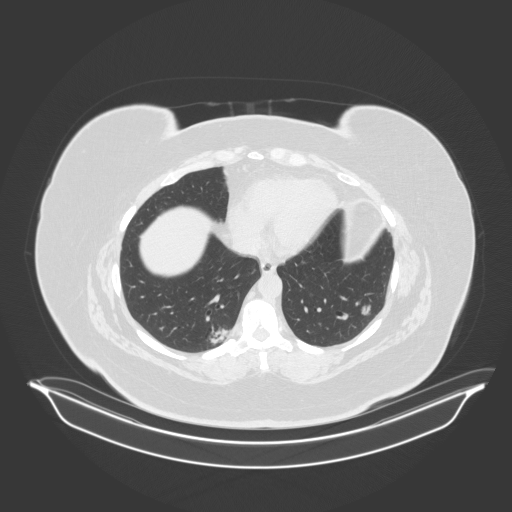
[im 59/148  lung]
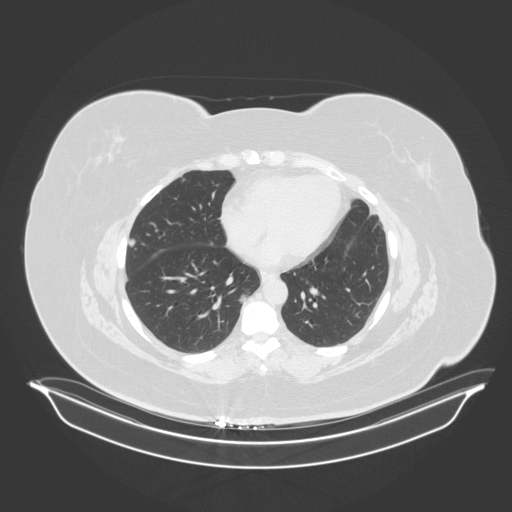
[im 66/148  lung]
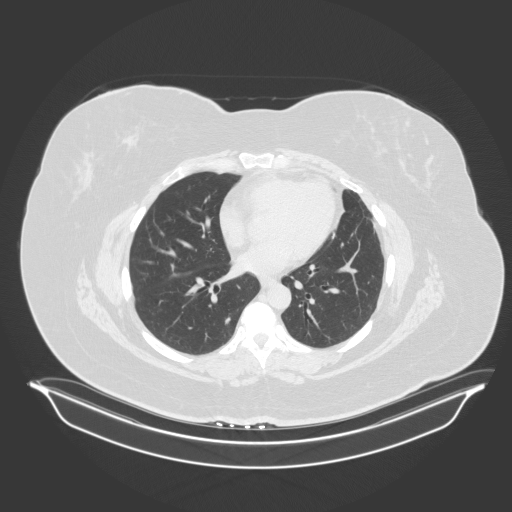
[im 77/148  lung]
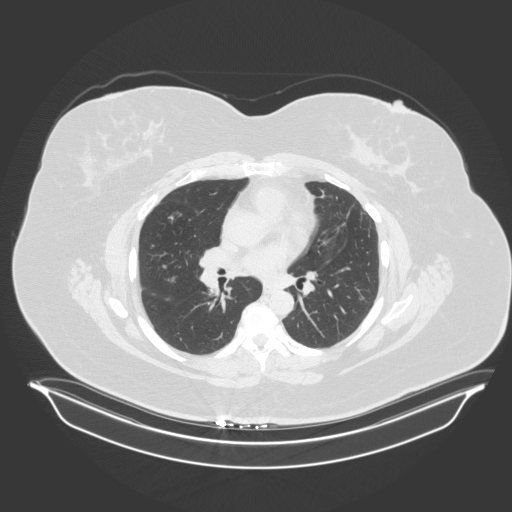
[im 82/148  mediastinal]
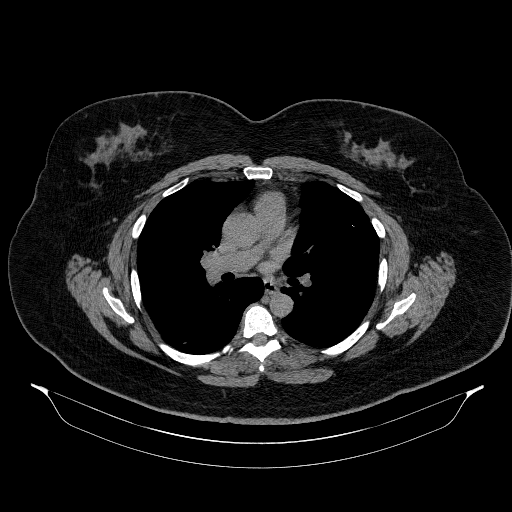
[im 82/148  lung]
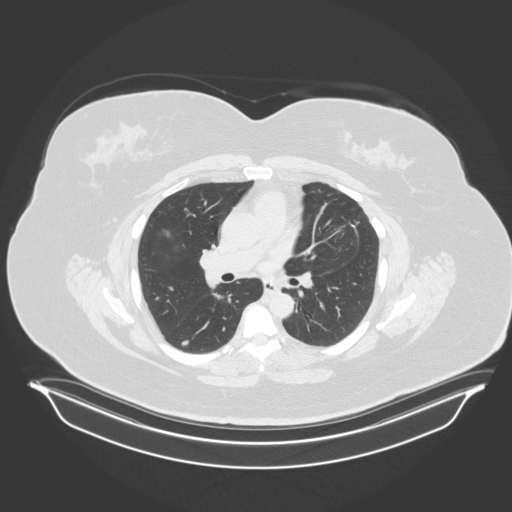
[im 89/148  lung]
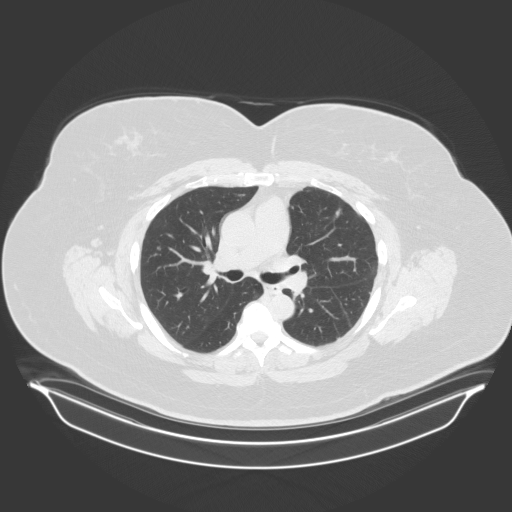
[im 99/148  lung]
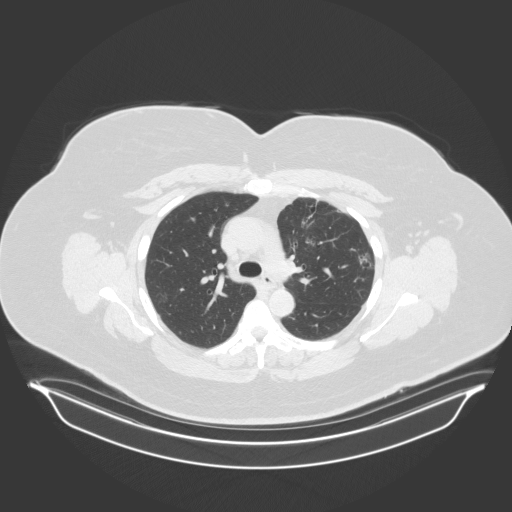
[im 109/148  lung]
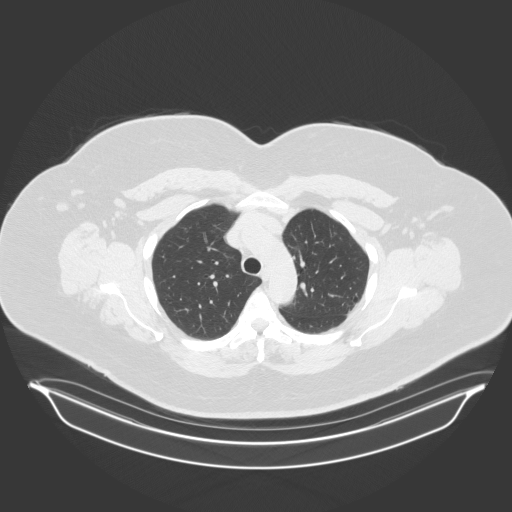
[im 118/148  mediastinal]
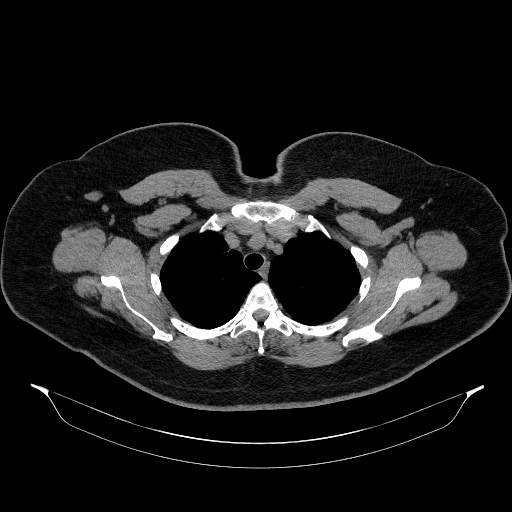
[im 118/148  lung]
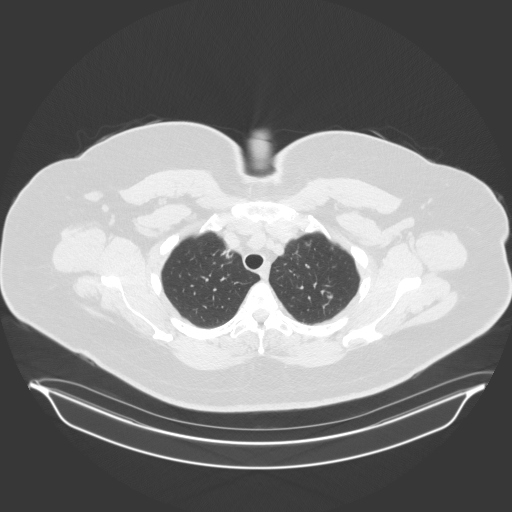
[im 126/148  lung]
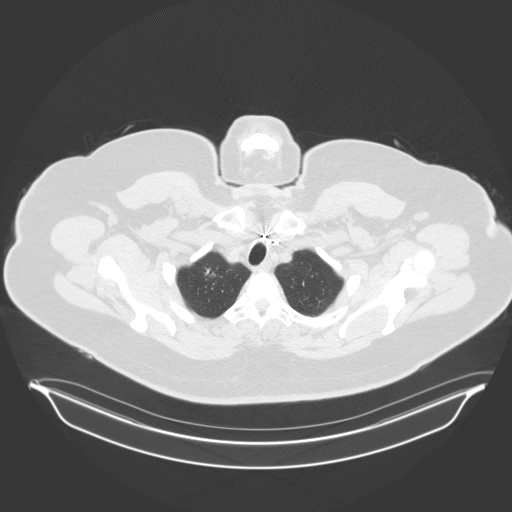
[im 137/148  lung]
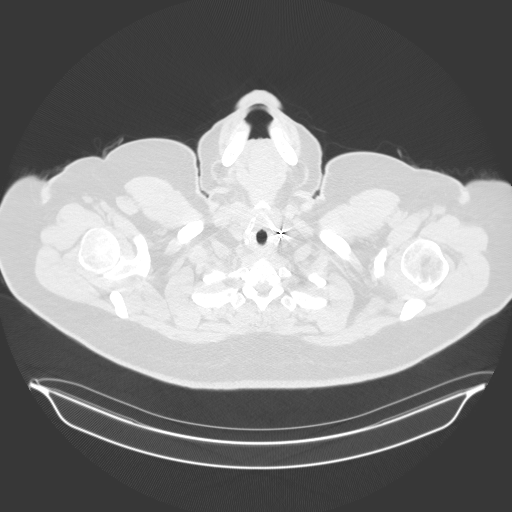

[15 of 34 positions shown; findings below may reference images not displayed]

FINDINGS: Cardiovascular: The heart size is normal. No substantial pericardial
effusion. No thoracic aortic aneurysm.

Mediastinum/Nodes: No mediastinal lymphadenopathy. No evidence for
gross hilar lymphadenopathy although assessment is limited by the
lack of intravenous contrast on today's study. The esophagus has
normal imaging features. There is no axillary lymphadenopathy.

Lungs/Pleura: 7 mm spiculated nodule is identified in the right lung
apex on image [DATE]. Multiple additional pulmonary nodules evident
bilaterally including 9 mm right upper lobe nodule on 48/5, 6 mm
right middle lobe nodule on 91/5, 10 mm left lower lobe nodule on
86/5 and 11 mm left lower lobe nodule on 100/5. Dominant nodular
lesion on today's study is a 2.9 x 1.5 cm paraspinal right lower
lobe lesion on 104/5.

Staple line along the left major fissure suggest prior wedge
resection. No pleural effusion.

Upper Abdomen: The liver shows diffusely decreased attenuation
suggesting fat deposition.

Musculoskeletal: No worrisome lytic or sclerotic osseous
abnormality.
IMPRESSION: 1. Multiple bilateral pulmonary nodules, measuring up to 2.9 cm.
Imaging features highly suspicious for metastatic disease. Dominant
2.9 cm right lower lobe lesion could represent a lung primary.
PET-CT recommended to further evaluate.
2. Hepatic steatosis.

These results will be called to the ordering clinician or
representative by the Radiologist Assistant, and communication
documented in the PACS or [REDACTED].

## 2022-07-14 ENCOUNTER — Telehealth: Payer: Self-pay | Admitting: Emergency Medicine

## 2022-07-14 DIAGNOSIS — R918 Other nonspecific abnormal finding of lung field: Secondary | ICD-10-CM

## 2022-07-14 NOTE — Telephone Encounter (Signed)
New order has been placed. Nothing further needed.  

## 2022-08-17 ENCOUNTER — Other Ambulatory Visit: Payer: Self-pay | Admitting: Family Medicine

## 2022-08-17 DIAGNOSIS — Z1231 Encounter for screening mammogram for malignant neoplasm of breast: Secondary | ICD-10-CM

## 2022-09-09 ENCOUNTER — Ambulatory Visit
Admission: RE | Admit: 2022-09-09 | Discharge: 2022-09-09 | Disposition: A | Discharge status: Home / Self Care | Payer: Medicare HMO | Source: Ambulatory Visit | Attending: Emergency Medicine | Admitting: Emergency Medicine

## 2022-09-09 DIAGNOSIS — R911 Solitary pulmonary nodule: Secondary | ICD-10-CM | POA: Diagnosis not present

## 2022-09-09 DIAGNOSIS — R918 Other nonspecific abnormal finding of lung field: Secondary | ICD-10-CM | POA: Diagnosis not present

## 2022-09-20 DIAGNOSIS — H6122 Impacted cerumen, left ear: Secondary | ICD-10-CM | POA: Diagnosis not present

## 2022-09-27 ENCOUNTER — Ambulatory Visit
Admission: RE | Admit: 2022-09-27 | Discharge: 2022-09-27 | Disposition: A | Discharge status: Home / Self Care | Payer: Medicare HMO | Source: Ambulatory Visit | Attending: Family Medicine | Admitting: Family Medicine

## 2022-09-27 DIAGNOSIS — Z1231 Encounter for screening mammogram for malignant neoplasm of breast: Secondary | ICD-10-CM | POA: Diagnosis not present

## 2022-09-28 ENCOUNTER — Ambulatory Visit (INDEPENDENT_AMBULATORY_CARE_PROVIDER_SITE_OTHER): Payer: Medicare HMO | Admitting: Emergency Medicine

## 2022-09-28 ENCOUNTER — Encounter: Payer: Self-pay | Admitting: Emergency Medicine

## 2022-09-28 DIAGNOSIS — R918 Other nonspecific abnormal finding of lung field: Secondary | ICD-10-CM | POA: Diagnosis not present

## 2022-09-28 NOTE — Patient Instructions (Addendum)
We reviewed your CT scan of the chest today and compared it to your priors.  Your pulmonary nodules remain stable, have been stable since 03/02/2021.  This is good news. We will plan to repeat your CT chest in October 2024. Follow Dr. Lamonte Sakai in October 2024 to review your scan or sooner if you have any problems.

## 2022-09-28 NOTE — Assessment & Plan Note (Signed)
Etiology unclear.  Her autoimmune serologies are negative, pathology has been benign without any clear evidence for vasculitis or granulomas -mainly plasma cells, lymphocytes back in 1998.  Stable on serial imaging and asymptomatic.  Plan to do surveillance repeat scan October 2024.  We reviewed your CT scan of the chest today and compared it to your priors.  Your pulmonary nodules remain stable, have been stable since 03/02/2021.  This is good news. We will plan to repeat your CT chest in October 2024. Follow Dr. Lamonte Sakai in October 2024 to review your scan or sooner if you have any problems.

## 2022-09-28 NOTE — Addendum Note (Signed)
Addended by: Loma Sousa on: 09/28/2022 10:52 AM   Modules accepted: Orders

## 2022-09-28 NOTE — Progress Notes (Signed)
Subjective:    Patient ID: Karla Erickson, female    DOB: 1956-08-14, 66 y.o.   MRN: [id number]  HPI  ROV 05/07/21 --follow-up visit for 66 year old woman with a minimal tobacco history, remote left upper lobe biopsy for pulmonary nodule that was not malignant, not vasculitis (1998).  It was suspected to possibly be related to connective tissue disease.  I saw her for evaluation of pulmonary nodular disease on CT chest 03/02/2021.  A predominant 2.9 cm solid right lower lobe nodule was mildly hypermetabolic on PET scan 02/09/6439.  Numerous other bilateral pulmonary nodules were below the size detectability. She underwent bronchoscopy 04/20/2021 with sampling of the predominant right lower lobe pulmonary nodule, a spiculated right upper lobe nodule and an inferior left lower lobe nodule.  All the cytology was negative for malignancy.  Fungal and AFB cultures are negative  Serologies 04/02/2021 negative ANA, ANCA, ACE level, RF.  ROV 09/28/22 --66 year old woman with a history of possible connective tissue disease (negative serologies) with associated pulmonary nodular disease.  She had biopsy by Dr. Arlyce Dice 1998, not malignant, not vasculitis.  We have followed other pulmonary nodules including right lower lobe 2.9 cm nodule negative on bronchoscopy 04/20/2021, all cultures negative as well. She returns reporting good breathing, good exertional tolerance. She walks, shops. No cough or wheeze.   Repeat CT scan of the chest 09/09/2022 reviewed by me, shows no significant large mediastinal or hilar adenopathy, bilateral scattered pulmonary nodules that have not changed in size or appearance including: Dominant posterior right lower lobe nodule 3.0 x 1.3 cm, 11 mm basilar left lower lobe nodule, 6 mm right middle lobe nodule, 9 mm right upper lobe nodule, 7 mm spiculated right apical nodule.  Evidence of her prior wedge resection in the anterior left upper lobe.  All stable going back to 03/02/2021   Review of  Systems As per HPI      Objective:   Physical Exam Vitals:   09/28/22 1016  BP: 130/76  Pulse: 81  Temp: 97.9 F (36.6 C)  TempSrc: Oral  SpO2: 99%  Weight: 217 lb 6.4 oz (98.6 kg)  Height: 5\' 3"  (1.6 m)   Gen: Pleasant, overwt woman, in no distress,  normal affect  ENT: No lesions,  mouth clear,  oropharynx clear, no postnasal drip  Neck: No JVD, no stridor  Lungs: No use of accessory muscles, no crackles or wheezing on normal respiration, no wheeze on forced expiration  Cardiovascular: RRR, heart sounds normal, no murmur or gallops, no peripheral edema  Musculoskeletal: No deformities, no cyanosis or clubbing  Neuro: alert, awake, non focal  Skin: Warm, no lesions or rash     Assessment & Plan:  Pulmonary nodules/lesions, multiple Etiology unclear.  Her autoimmune serologies are negative, pathology has been benign without any clear evidence for vasculitis or granulomas -mainly plasma cells, lymphocytes back in 1998.  Stable on serial imaging and asymptomatic.  Plan to do surveillance repeat scan October 2024.  We reviewed your CT scan of the chest today and compared it to your priors.  Your pulmonary nodules remain stable, have been stable since 03/02/2021.  This is good news. We will plan to repeat your CT chest in October 2024. Follow Dr. Lamonte Sakai in October 2024 to review your scan or sooner if you have any problems.   Baltazar Apo, MD, PhD 09/28/2022, 10:35 AM [facility] [phone] or if no answer before 7:00PM call [phone] For any issues after 7:00PM please call eLink  [phone]  

## 2022-10-19 ENCOUNTER — Ambulatory Visit
Admission: RE | Admit: 2022-10-19 | Discharge: 2022-10-19 | Disposition: A | Discharge status: Home / Self Care | Payer: Medicare HMO | Source: Ambulatory Visit | Attending: Internal Medicine | Admitting: Internal Medicine

## 2022-10-19 DIAGNOSIS — M8589 Other specified disorders of bone density and structure, multiple sites: Secondary | ICD-10-CM

## 2022-10-19 DIAGNOSIS — Z780 Asymptomatic menopausal state: Secondary | ICD-10-CM | POA: Diagnosis not present

## 2022-11-01 DIAGNOSIS — E7800 Pure hypercholesterolemia, unspecified: Secondary | ICD-10-CM | POA: Diagnosis not present

## 2022-11-01 DIAGNOSIS — Z6841 Body Mass Index (BMI) 40.0 and over, adult: Secondary | ICD-10-CM | POA: Diagnosis not present

## 2022-11-01 DIAGNOSIS — E1169 Type 2 diabetes mellitus with other specified complication: Secondary | ICD-10-CM | POA: Diagnosis not present

## 2022-11-01 DIAGNOSIS — Z7984 Long term (current) use of oral hypoglycemic drugs: Secondary | ICD-10-CM | POA: Diagnosis not present

## 2022-11-01 DIAGNOSIS — E039 Hypothyroidism, unspecified: Secondary | ICD-10-CM | POA: Diagnosis not present

## 2022-11-01 DIAGNOSIS — I10 Essential (primary) hypertension: Secondary | ICD-10-CM | POA: Diagnosis not present

## 2022-11-12 ENCOUNTER — Ambulatory Visit
Admission: RE | Admit: 2022-11-12 | Discharge: 2022-11-12 | Disposition: A | Discharge status: Home / Self Care | Payer: Medicare HMO | Source: Ambulatory Visit | Attending: Physician Assistant | Admitting: Physician Assistant

## 2022-11-12 ENCOUNTER — Other Ambulatory Visit: Payer: Self-pay | Admitting: Physician Assistant

## 2022-11-12 DIAGNOSIS — M79641 Pain in right hand: Secondary | ICD-10-CM | POA: Diagnosis not present

## 2022-11-12 DIAGNOSIS — Z8719 Personal history of other diseases of the digestive system: Secondary | ICD-10-CM | POA: Diagnosis not present

## 2023-05-03 DIAGNOSIS — E039 Hypothyroidism, unspecified: Secondary | ICD-10-CM | POA: Diagnosis not present

## 2023-05-03 DIAGNOSIS — J849 Interstitial pulmonary disease, unspecified: Secondary | ICD-10-CM | POA: Diagnosis not present

## 2023-05-03 DIAGNOSIS — I700 Atherosclerosis of aorta: Secondary | ICD-10-CM | POA: Diagnosis not present

## 2023-05-03 DIAGNOSIS — I10 Essential (primary) hypertension: Secondary | ICD-10-CM | POA: Diagnosis not present

## 2023-05-03 DIAGNOSIS — E119 Type 2 diabetes mellitus without complications: Secondary | ICD-10-CM | POA: Diagnosis not present

## 2023-05-03 DIAGNOSIS — E1169 Type 2 diabetes mellitus with other specified complication: Secondary | ICD-10-CM | POA: Diagnosis not present

## 2023-05-03 DIAGNOSIS — Z7984 Long term (current) use of oral hypoglycemic drugs: Secondary | ICD-10-CM | POA: Diagnosis not present

## 2023-05-03 DIAGNOSIS — E7800 Pure hypercholesterolemia, unspecified: Secondary | ICD-10-CM | POA: Diagnosis not present

## 2023-05-03 DIAGNOSIS — Z0000 Encounter for general adult medical examination without abnormal findings: Secondary | ICD-10-CM | POA: Diagnosis not present

## 2023-05-03 DIAGNOSIS — Z23 Encounter for immunization: Secondary | ICD-10-CM | POA: Diagnosis not present

## 2023-05-04 DIAGNOSIS — E119 Type 2 diabetes mellitus without complications: Secondary | ICD-10-CM | POA: Diagnosis not present

## 2023-06-08 DIAGNOSIS — Z09 Encounter for follow-up examination after completed treatment for conditions other than malignant neoplasm: Secondary | ICD-10-CM | POA: Diagnosis not present

## 2023-06-08 DIAGNOSIS — K5730 Diverticulosis of large intestine without perforation or abscess without bleeding: Secondary | ICD-10-CM | POA: Diagnosis not present

## 2023-06-08 DIAGNOSIS — K640 First degree hemorrhoids: Secondary | ICD-10-CM | POA: Diagnosis not present

## 2023-06-08 DIAGNOSIS — Z86010 Personal history of colonic polyps: Secondary | ICD-10-CM | POA: Diagnosis not present

## 2023-08-17 ENCOUNTER — Encounter: Payer: Self-pay | Admitting: Emergency Medicine

## 2023-08-19 ENCOUNTER — Other Ambulatory Visit: Payer: Self-pay | Admitting: Internal Medicine

## 2023-08-19 DIAGNOSIS — Z1231 Encounter for screening mammogram for malignant neoplasm of breast: Secondary | ICD-10-CM

## 2023-09-13 ENCOUNTER — Inpatient Hospital Stay: Admission: RE | Admit: 2023-09-13 | Payer: Medicare HMO | Source: Ambulatory Visit

## 2023-09-22 ENCOUNTER — Ambulatory Visit: Payer: Medicare HMO | Admitting: Emergency Medicine

## 2023-09-22 ENCOUNTER — Encounter: Payer: Self-pay | Admitting: Emergency Medicine

## 2023-09-22 VITALS — BP 139/82 | HR 91 | Ht 63.0 in | Wt 197.4 lb

## 2023-09-22 DIAGNOSIS — R918 Other nonspecific abnormal finding of lung field: Secondary | ICD-10-CM

## 2023-09-22 NOTE — Patient Instructions (Signed)
I am glad that you are feeling well We will work on scheduling your planned surveillance CT scan of his chest next available Dr. Delton Coombes will contact you with results on the CT Will plan to follow in 1 year.  If there is any change on the CT scan that would need to discuss and we will make an earlier appointment. Flu shot and COVID-19 shot are both up-to-date

## 2023-09-22 NOTE — Progress Notes (Signed)
Subjective:    Patient ID: Karla Erickson, female    DOB: 1956-09-05, 67 y.o.   MRN: [id number]  HPI  ROV 09/28/22 --67 year old woman with a history of possible connective tissue disease (negative serologies) with associated pulmonary nodular disease.  She had biopsy by Dr. Edwyna Shell 1998, not malignant, not vasculitis.  We have followed other pulmonary nodules including right lower lobe 2.9 cm nodule negative on bronchoscopy 04/20/2021, all cultures negative as well. She returns reporting good breathing, good exertional tolerance. She walks, shops. No cough or wheeze.   Repeat CT scan of the chest 09/09/2022 reviewed by me, shows no significant large mediastinal or hilar adenopathy, bilateral scattered pulmonary nodules that have not changed in size or appearance including: Dominant posterior right lower lobe nodule 3.0 x 1.3 cm, 11 mm basilar left lower lobe nodule, 6 mm right middle lobe nodule, 9 mm right upper lobe nodule, 7 mm spiculated right apical nodule.  Evidence of her prior wedge resection in the anterior left upper lobe.  All stable going back to 03/02/2021  ROV 09/22/23 --67 year old woman who follows up today for pulmonary nodules.  She has possible history of serology negative connective tissue disease, not well-established.  She underwent a biopsy by Dr. Edwyna Shell in 1998 and there was no evidence of vasculitis or malignancy.  I been following serial imaging including a right lower lobe nodule 2.9 cm that was negative on bronchoscopy 04/20/2021.  She reports that she is doing well.  We had planned to repeat her CT scan of the chest this month but it has not been done yet.  She has a good functional capacity, no SOB. She can sometimes get "strangled" when she eats or drinks. Food can get stuck. Happens fairly seldom, associated w cough.    Review of Systems As per HPI      Objective:   Physical Exam Vitals:   09/22/23 0831  BP: 139/82  Pulse: 91  SpO2: 97%  Weight: 197 lb 6.4 oz  (89.5 kg)  Height: 5\' 3"  (1.6 m)   Gen: Pleasant, overwt woman, in no distress,  normal affect  ENT: No lesions,  mouth clear,  oropharynx clear, no postnasal drip  Neck: No JVD, no stridor  Lungs: No use of accessory muscles, no crackles or wheezing on normal respiration, no wheeze on forced expiration  Cardiovascular: RRR, heart sounds normal, no murmur or gallops, no peripheral edema  Musculoskeletal: No deformities, no cyanosis or clubbing  Neuro: alert, awake, non focal  Skin: Warm, no lesions or rash     Assessment & Plan:  Pulmonary nodules/lesions, multiple Pulmonary nodular disease that has been negative for malignancy, vasculitis, granulomas on prior biopsies.  She is asymptomatic.  No new symptoms to report.  She does have some intermittent aspiration symptoms, question whether this could be related to 1 or more of the nodules, especially the larger more peripheral rounded right lower lobe nodule.  She is due for her repeat CT chest, has not yet been scheduled so we will work on this.  I can communicate the results with her by phone.  If stable we can probably continue to do surveillance.  If there is been any interval change then I will see her sooner and we can talk about repeat biopsy  I am glad that you are feeling well We will work on scheduling your planned surveillance CT scan of his chest next available Dr. Delton Coombes will contact you with results on the CT Will plan to  follow in 1 year.  If there is any change on the CT scan that would need to discuss and we will make an earlier appointment. Flu shot and COVID-19 shot are both up-to-date    Levy Pupa, MD, PhD 09/22/2023, 8:44 AM [facility] [phone] or if no answer before 7:00PM call [phone] For any issues after 7:00PM please call eLink [phone]

## 2023-09-22 NOTE — Assessment & Plan Note (Signed)
Pulmonary nodular disease that has been negative for malignancy, vasculitis, granulomas on prior biopsies.  She is asymptomatic.  No new symptoms to report.  She does have some intermittent aspiration symptoms, question whether this could be related to 1 or more of the nodules, especially the larger more peripheral rounded right lower lobe nodule.  She is due for her repeat CT chest, has not yet been scheduled so we will work on this.  I can communicate the results with her by phone.  If stable we can probably continue to do surveillance.  If there is been any interval change then I will see her sooner and we can talk about repeat biopsy  I am glad that you are feeling well We will work on scheduling your planned surveillance CT scan of his chest next available Dr. Delton Coombes will contact you with results on the CT Will plan to follow in 1 year.  If there is any change on the CT scan that would need to discuss and we will make an earlier appointment. Flu shot and COVID-19 shot are both up-to-date

## 2023-09-30 ENCOUNTER — Ambulatory Visit
Admission: RE | Admit: 2023-09-30 | Discharge: 2023-09-30 | Disposition: A | Discharge status: Home / Self Care | Payer: Medicare HMO | Source: Ambulatory Visit | Attending: Internal Medicine

## 2023-09-30 DIAGNOSIS — Z1231 Encounter for screening mammogram for malignant neoplasm of breast: Secondary | ICD-10-CM

## 2023-11-07 ENCOUNTER — Encounter: Payer: Self-pay | Admitting: Plastic Surgery

## 2023-11-08 DIAGNOSIS — E7800 Pure hypercholesterolemia, unspecified: Secondary | ICD-10-CM | POA: Diagnosis not present

## 2023-11-08 DIAGNOSIS — E119 Type 2 diabetes mellitus without complications: Secondary | ICD-10-CM | POA: Diagnosis not present

## 2023-11-08 DIAGNOSIS — Z6835 Body mass index (BMI) 35.0-35.9, adult: Secondary | ICD-10-CM | POA: Diagnosis not present

## 2023-11-08 DIAGNOSIS — E1169 Type 2 diabetes mellitus with other specified complication: Secondary | ICD-10-CM | POA: Diagnosis not present

## 2023-11-08 DIAGNOSIS — I10 Essential (primary) hypertension: Secondary | ICD-10-CM | POA: Diagnosis not present

## 2023-11-08 DIAGNOSIS — R600 Localized edema: Secondary | ICD-10-CM | POA: Diagnosis not present

## 2023-11-08 DIAGNOSIS — I700 Atherosclerosis of aorta: Secondary | ICD-10-CM | POA: Diagnosis not present

## 2023-11-08 DIAGNOSIS — E039 Hypothyroidism, unspecified: Secondary | ICD-10-CM | POA: Diagnosis not present

## 2023-11-08 DIAGNOSIS — J849 Interstitial pulmonary disease, unspecified: Secondary | ICD-10-CM | POA: Diagnosis not present

## 2023-12-20 ENCOUNTER — Other Ambulatory Visit (HOSPITAL_COMMUNITY): Payer: Self-pay

## 2023-12-21 ENCOUNTER — Other Ambulatory Visit (HOSPITAL_COMMUNITY): Payer: Self-pay

## 2023-12-22 ENCOUNTER — Other Ambulatory Visit (HOSPITAL_COMMUNITY): Payer: Self-pay

## 2023-12-22 MED ORDER — ATORVASTATIN CALCIUM 10 MG PO TABS
10.0000 mg | ORAL_TABLET | Freq: Every day | ORAL | 3 refills | Status: DC
  Filled 2024-04-03: qty 90, 90d supply, fill #0

## 2023-12-22 MED ORDER — ACCU-CHEK SOFTCLIX LANCETS MISC
3 refills | Status: AC
  Filled 2024-04-03: qty 100, fill #0

## 2023-12-22 MED ORDER — ACCU-CHEK GUIDE TEST VI STRP
ORAL_STRIP | 3 refills | Status: AC
  Filled 2024-04-03: qty 100, 90d supply, fill #0
  Filled 2024-07-04: qty 100, 90d supply, fill #1

## 2023-12-22 MED ORDER — AMLODIPINE BESYLATE 5 MG PO TABS
5.0000 mg | ORAL_TABLET | Freq: Every day | ORAL | 3 refills | Status: DC
  Filled 2024-04-03: qty 90, 90d supply, fill #0

## 2023-12-23 ENCOUNTER — Other Ambulatory Visit (HOSPITAL_COMMUNITY): Payer: Self-pay

## 2023-12-23 ENCOUNTER — Other Ambulatory Visit (HOSPITAL_BASED_OUTPATIENT_CLINIC_OR_DEPARTMENT_OTHER): Payer: Self-pay

## 2023-12-27 ENCOUNTER — Other Ambulatory Visit (HOSPITAL_COMMUNITY): Payer: Self-pay

## 2023-12-27 MED ORDER — METFORMIN HCL ER 500 MG PO TB24
2000.0000 mg | ORAL_TABLET | Freq: Every day | ORAL | 3 refills | Status: DC
  Filled 2024-04-03: qty 360, 90d supply, fill #0

## 2023-12-27 MED ORDER — LEVOTHYROXINE SODIUM 125 MCG PO TABS
ORAL_TABLET | Freq: Every morning | ORAL | 3 refills | Status: DC
  Filled 2024-04-03: qty 90, 30d supply, fill #0

## 2024-02-27 DIAGNOSIS — R202 Paresthesia of skin: Secondary | ICD-10-CM | POA: Diagnosis not present

## 2024-04-04 ENCOUNTER — Other Ambulatory Visit: Payer: Self-pay

## 2024-04-04 ENCOUNTER — Other Ambulatory Visit (HOSPITAL_COMMUNITY): Payer: Self-pay

## 2024-04-06 DIAGNOSIS — E119 Type 2 diabetes mellitus without complications: Secondary | ICD-10-CM | POA: Diagnosis not present

## 2024-05-08 DIAGNOSIS — E039 Hypothyroidism, unspecified: Secondary | ICD-10-CM | POA: Diagnosis not present

## 2024-05-08 DIAGNOSIS — I10 Essential (primary) hypertension: Secondary | ICD-10-CM | POA: Diagnosis not present

## 2024-05-08 DIAGNOSIS — J849 Interstitial pulmonary disease, unspecified: Secondary | ICD-10-CM | POA: Diagnosis not present

## 2024-05-08 DIAGNOSIS — R911 Solitary pulmonary nodule: Secondary | ICD-10-CM | POA: Diagnosis not present

## 2024-05-08 DIAGNOSIS — E1169 Type 2 diabetes mellitus with other specified complication: Secondary | ICD-10-CM | POA: Diagnosis not present

## 2024-05-08 DIAGNOSIS — E7800 Pure hypercholesterolemia, unspecified: Secondary | ICD-10-CM | POA: Diagnosis not present

## 2024-05-08 DIAGNOSIS — Z0000 Encounter for general adult medical examination without abnormal findings: Secondary | ICD-10-CM | POA: Diagnosis not present

## 2024-05-08 DIAGNOSIS — I700 Atherosclerosis of aorta: Secondary | ICD-10-CM | POA: Diagnosis not present

## 2024-07-04 ENCOUNTER — Other Ambulatory Visit: Payer: Self-pay

## 2024-07-04 ENCOUNTER — Other Ambulatory Visit (HOSPITAL_COMMUNITY): Payer: Self-pay

## 2024-07-04 MED ORDER — ATORVASTATIN CALCIUM 10 MG PO TABS
10.0000 mg | ORAL_TABLET | Freq: Every day | ORAL | 3 refills | Status: AC
  Filled 2024-07-04: qty 90, 90d supply, fill #0
  Filled 2024-10-15: qty 90, 90d supply, fill #1
  Filled 2024-12-26: qty 90, 90d supply, fill #2

## 2024-07-04 MED ORDER — METFORMIN HCL ER 500 MG PO TB24
ORAL_TABLET | ORAL | 3 refills | Status: AC
  Filled 2024-07-04: qty 180, 90d supply, fill #0
  Filled 2024-12-26: qty 180, 90d supply, fill #1

## 2024-07-04 MED ORDER — LEVOTHYROXINE SODIUM 125 MCG PO TABS
125.0000 ug | ORAL_TABLET | Freq: Every morning | ORAL | 3 refills | Status: AC
  Filled 2024-07-04: qty 90, 90d supply, fill #0
  Filled 2024-10-15: qty 90, 90d supply, fill #1
  Filled 2024-12-26: qty 90, 90d supply, fill #2

## 2024-07-04 MED ORDER — AMLODIPINE BESYLATE 10 MG PO TABS
10.0000 mg | ORAL_TABLET | Freq: Every day | ORAL | 3 refills | Status: AC
  Filled 2024-07-04: qty 90, 90d supply, fill #0
  Filled 2024-10-15: qty 90, 90d supply, fill #1
  Filled 2024-12-26: qty 90, 90d supply, fill #2

## 2024-07-05 ENCOUNTER — Other Ambulatory Visit: Payer: Self-pay

## 2024-07-12 ENCOUNTER — Other Ambulatory Visit (HOSPITAL_COMMUNITY): Payer: Self-pay

## 2024-07-12 ENCOUNTER — Other Ambulatory Visit: Payer: Self-pay

## 2024-07-18 ENCOUNTER — Other Ambulatory Visit: Payer: Self-pay

## 2024-08-28 ENCOUNTER — Other Ambulatory Visit: Payer: Self-pay | Admitting: Internal Medicine

## 2024-08-28 DIAGNOSIS — Z1231 Encounter for screening mammogram for malignant neoplasm of breast: Secondary | ICD-10-CM

## 2024-09-20 ENCOUNTER — Telehealth: Payer: Self-pay | Admitting: *Deleted

## 2024-09-20 DIAGNOSIS — R918 Other nonspecific abnormal finding of lung field: Secondary | ICD-10-CM

## 2024-09-20 NOTE — Telephone Encounter (Signed)
 Super D CT scan was not ordered 09/2023. I have entered order please sign if this is want's needed.    Instructions   Return in about 1 year (around 09/21/2024) for With Dr. Shelah. I am glad that you are feeling well We will work on scheduling your planned surveillance CT scan of his chest next available Dr. Shelah will contact you with results on the CT Will plan to follow in 1 year.  If there is any change on the CT scan that would need to discuss and we will make an earlier appointment. Flu shot and COVID-19 shot are both up-to-date

## 2024-09-21 NOTE — Telephone Encounter (Signed)
 [facility] can this please be scheduled and f/u with RB to review as well. Thanks!

## 2024-09-21 NOTE — Telephone Encounter (Signed)
 We need to ensure that CT gets scheduled and that she has follow-up to review.  Thank you

## 2024-09-21 NOTE — Telephone Encounter (Signed)
 Pt has been scheduled for CT and F/U and had been made aware. NFN

## 2024-09-28 ENCOUNTER — Ambulatory Visit
Admission: RE | Admit: 2024-09-28 | Discharge: 2024-09-28 | Disposition: A | Discharge status: Home / Self Care | Source: Ambulatory Visit | Attending: Emergency Medicine | Admitting: Emergency Medicine

## 2024-09-28 DIAGNOSIS — J984 Other disorders of lung: Secondary | ICD-10-CM | POA: Diagnosis not present

## 2024-09-28 DIAGNOSIS — R918 Other nonspecific abnormal finding of lung field: Secondary | ICD-10-CM

## 2024-10-01 ENCOUNTER — Ambulatory Visit
Admission: RE | Admit: 2024-10-01 | Discharge: 2024-10-01 | Disposition: A | Source: Ambulatory Visit | Attending: Internal Medicine

## 2024-10-01 DIAGNOSIS — Z1231 Encounter for screening mammogram for malignant neoplasm of breast: Secondary | ICD-10-CM | POA: Diagnosis not present

## 2024-10-04 ENCOUNTER — Other Ambulatory Visit: Payer: Self-pay | Admitting: Internal Medicine

## 2024-10-04 DIAGNOSIS — R928 Other abnormal and inconclusive findings on diagnostic imaging of breast: Secondary | ICD-10-CM

## 2024-10-15 ENCOUNTER — Other Ambulatory Visit (HOSPITAL_COMMUNITY): Payer: Self-pay

## 2024-10-16 ENCOUNTER — Ambulatory Visit
Admission: RE | Admit: 2024-10-16 | Discharge: 2024-10-16 | Disposition: A | Discharge status: Home / Self Care | Source: Ambulatory Visit | Attending: Internal Medicine | Admitting: Internal Medicine

## 2024-10-16 ENCOUNTER — Ambulatory Visit

## 2024-10-16 DIAGNOSIS — R928 Other abnormal and inconclusive findings on diagnostic imaging of breast: Secondary | ICD-10-CM | POA: Diagnosis not present

## 2024-10-26 ENCOUNTER — Ambulatory Visit: Admitting: Emergency Medicine

## 2024-10-26 ENCOUNTER — Encounter: Payer: Self-pay | Admitting: Emergency Medicine

## 2024-10-26 VITALS — BP 108/72 | HR 94 | Ht 63.0 in | Wt 206.8 lb

## 2024-10-26 DIAGNOSIS — R918 Other nonspecific abnormal finding of lung field: Secondary | ICD-10-CM | POA: Diagnosis not present

## 2024-10-26 NOTE — Assessment & Plan Note (Signed)
 Reviewed her CT scan of the chest with her today.  Pulmonary nodules are overall stable in size and appearance.  Her biopsies from 04/2021 were negative.  She also had a negative surgical biopsy in the lingula in 1998.  Suspect that these are inflammatory or autoimmune in nature.  They will need to continue to be followed for interval stability.  There was a new small 4 mm anterior left upper lobe nodule noted.  We will repeat her CT chest in October 2026.  Follow-up to review and determine whether any other diagnostics are indicated.

## 2024-10-26 NOTE — Progress Notes (Signed)
   Subjective:    Patient ID: Karla Erickson, female    DOB: 03/16/1956, 68 y.o.   MRN: [id number]  HPI  ROV 10/26/2024 --follow-up visit for 68 year old woman with remote minimal tobacco hx, a history of serological negative connective tissue disease, pulmonary nodules.  She had remote biopsy by Dr. Brantley in 1998 with no evidence of vasculitis or malignancy.  We have been following serial imaging. She had a negative Nav bronch 04/2021 >> multiple nodules biopsied, all negative for malignancy.   CT chest 09/28/2024 reviewed by me, shows numerous bilateral pulmonary nodules and ground glass opacities bilaterally that are overall unchanged.  Most notable is a subpleural nodule in the dependent right lower lobe 3 x 1.3 cm.  There is a peripheral right upper lobe nodule 7 mm.  There is a new small nodule in the anterior left upper lobe 4 mm.   Review of Systems As per HPI      Objective:   Physical Exam Vitals:   10/26/24 1051  BP: 108/72  Pulse: 94  SpO2: 95%  Weight: 206 lb 12.8 oz (93.8 kg)  Height: 5' 3 (1.6 m)   Gen: Pleasant, overwt woman, in no distress,  normal affect  ENT: No lesions,  mouth clear,  oropharynx clear, no postnasal drip  Neck: No JVD, no stridor  Lungs: No use of accessory muscles, no crackles or wheezing on normal respiration, no wheeze on forced expiration  Cardiovascular: RRR, heart sounds normal, no murmur or gallops, no peripheral edema  Musculoskeletal: No deformities, no cyanosis or clubbing  Neuro: alert, awake, non focal  Skin: Warm, no lesions or rash     Assessment & Plan:  Pulmonary nodules/lesions, multiple Reviewed her CT scan of the chest with her today.  Pulmonary nodules are overall stable in size and appearance.  Her biopsies from 04/2021 were negative.  She also had a negative surgical biopsy in the lingula in 1998.  Suspect that these are inflammatory or autoimmune in nature.  They will need to continue to be followed for interval  stability.  There was a new small 4 mm anterior left upper lobe nodule noted.  We will repeat her CT chest in October 2026.  Follow-up to review and determine whether any other diagnostics are indicated.     Lamar Chris, MD, PhD 10/26/2024, 11:22 AM [facility] [phone] or if no answer before 7:00PM call [phone] For any issues after 7:00PM please call eLink [phone]

## 2024-10-26 NOTE — Patient Instructions (Signed)
 We reviewed your CT scan of the chest today.  This is overall stable compared with your priors.  Good news. We will plan to repeat your CT chest in October 2026. Follow Dr. Shelah in October 2026 after your CT so we can review those results together.

## 2024-11-07 ENCOUNTER — Other Ambulatory Visit (HOSPITAL_COMMUNITY): Payer: Self-pay

## 2024-11-07 DIAGNOSIS — R911 Solitary pulmonary nodule: Secondary | ICD-10-CM | POA: Diagnosis not present

## 2024-11-07 DIAGNOSIS — I700 Atherosclerosis of aorta: Secondary | ICD-10-CM | POA: Diagnosis not present

## 2024-11-07 DIAGNOSIS — J849 Interstitial pulmonary disease, unspecified: Secondary | ICD-10-CM | POA: Diagnosis not present

## 2024-11-07 DIAGNOSIS — E7800 Pure hypercholesterolemia, unspecified: Secondary | ICD-10-CM | POA: Diagnosis not present

## 2024-11-07 DIAGNOSIS — I10 Essential (primary) hypertension: Secondary | ICD-10-CM | POA: Diagnosis not present

## 2024-11-07 DIAGNOSIS — E1169 Type 2 diabetes mellitus with other specified complication: Secondary | ICD-10-CM | POA: Diagnosis not present

## 2024-11-07 DIAGNOSIS — E039 Hypothyroidism, unspecified: Secondary | ICD-10-CM | POA: Diagnosis not present

## 2024-11-08 ENCOUNTER — Other Ambulatory Visit (HOSPITAL_COMMUNITY): Payer: Self-pay

## 2024-11-09 ENCOUNTER — Other Ambulatory Visit (HOSPITAL_COMMUNITY): Payer: Self-pay

## 2024-11-10 ENCOUNTER — Other Ambulatory Visit (HOSPITAL_COMMUNITY): Payer: Self-pay

## 2024-11-10 MED ORDER — OZEMPIC (0.25 OR 0.5 MG/DOSE) 2 MG/3ML ~~LOC~~ SOPN
0.2500 mg | PEN_INJECTOR | SUBCUTANEOUS | 1 refills | Status: AC
  Filled 2024-11-10: qty 3, 28d supply, fill #0
  Filled 2024-11-19: qty 3, 30d supply, fill #0
  Filled 2024-12-26: qty 3, 30d supply, fill #1

## 2024-11-19 ENCOUNTER — Other Ambulatory Visit (HOSPITAL_COMMUNITY): Payer: Self-pay

## 2024-11-20 ENCOUNTER — Other Ambulatory Visit (HOSPITAL_COMMUNITY): Payer: Self-pay

## 2024-11-20 ENCOUNTER — Other Ambulatory Visit: Payer: Self-pay

## 2024-11-26 ENCOUNTER — Other Ambulatory Visit (HOSPITAL_COMMUNITY): Payer: Self-pay

## 2024-12-25 ENCOUNTER — Ambulatory Visit
Admission: RE | Admit: 2024-12-25 | Discharge: 2024-12-25 | Disposition: A | Source: Ambulatory Visit | Attending: Physician Assistant | Admitting: Physician Assistant

## 2024-12-25 ENCOUNTER — Other Ambulatory Visit: Payer: Self-pay | Admitting: Physician Assistant

## 2024-12-25 DIAGNOSIS — M25562 Pain in left knee: Secondary | ICD-10-CM

## 2024-12-26 ENCOUNTER — Other Ambulatory Visit: Payer: Self-pay
